# Patient Record
Sex: Female | Born: 1995 | Race: Black or African American | Hispanic: No | Marital: Single | Smoking: Never smoker
Health system: Southern US, Community
[De-identification: ages and names within clinical notes are randomized; demographics above are authoritative.]

## PROBLEM LIST (undated history)

## (undated) DIAGNOSIS — Z9889 Other specified postprocedural states: Secondary | ICD-10-CM

## (undated) DIAGNOSIS — F419 Anxiety disorder, unspecified: Secondary | ICD-10-CM

## (undated) DIAGNOSIS — T8859XA Other complications of anesthesia, initial encounter: Secondary | ICD-10-CM

## (undated) DIAGNOSIS — J020 Streptococcal pharyngitis: Secondary | ICD-10-CM

## (undated) DIAGNOSIS — U071 COVID-19: Secondary | ICD-10-CM

## (undated) DIAGNOSIS — R112 Nausea with vomiting, unspecified: Secondary | ICD-10-CM

## (undated) HISTORY — DX: Anxiety disorder, unspecified: F41.9

## (undated) HISTORY — DX: COVID-19: U07.1

## (undated) HISTORY — PX: KNEE SURGERY: SHX244

---
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
## 2014-06-07 ENCOUNTER — Emergency Department (HOSPITAL_COMMUNITY): Payer: 59

## 2014-06-07 ENCOUNTER — Emergency Department (HOSPITAL_COMMUNITY)
Admission: EM | Admit: 2014-06-07 | Discharge: 2014-06-07 | Disposition: A | Discharge status: Home / Self Care | Payer: 59 | Attending: Emergency Medicine | Admitting: Emergency Medicine

## 2014-06-07 ENCOUNTER — Encounter (HOSPITAL_COMMUNITY): Payer: Self-pay | Admitting: *Deleted

## 2014-06-07 DIAGNOSIS — Z720 Tobacco use: Secondary | ICD-10-CM | POA: Diagnosis not present

## 2014-06-07 DIAGNOSIS — S51811A Laceration without foreign body of right forearm, initial encounter: Secondary | ICD-10-CM | POA: Insufficient documentation

## 2014-06-07 DIAGNOSIS — Z79899 Other long term (current) drug therapy: Secondary | ICD-10-CM | POA: Insufficient documentation

## 2014-06-07 DIAGNOSIS — W25XXXA Contact with sharp glass, initial encounter: Secondary | ICD-10-CM | POA: Insufficient documentation

## 2014-06-07 DIAGNOSIS — Y9289 Other specified places as the place of occurrence of the external cause: Secondary | ICD-10-CM | POA: Insufficient documentation

## 2014-06-07 DIAGNOSIS — Z8709 Personal history of other diseases of the respiratory system: Secondary | ICD-10-CM | POA: Diagnosis not present

## 2014-06-07 DIAGNOSIS — Y9389 Activity, other specified: Secondary | ICD-10-CM | POA: Insufficient documentation

## 2014-06-07 DIAGNOSIS — Y998 Other external cause status: Secondary | ICD-10-CM | POA: Insufficient documentation

## 2014-06-07 DIAGNOSIS — S61511A Laceration without foreign body of right wrist, initial encounter: Secondary | ICD-10-CM | POA: Diagnosis present

## 2014-06-07 MED ORDER — LIDOCAINE-EPINEPHRINE (PF) 2 %-1:200000 IJ SOLN
20.0000 mL | Freq: Once | INTRAMUSCULAR | Status: AC
  Administered 2014-06-07: 20 mL
  Filled 2014-06-07: qty 20

## 2014-06-07 NOTE — ED Provider Notes (Signed)
CSN: [id number]     Arrival date & time 06/07/14  1005 History  This chart was scribed for Renne Crigler, PA-C, working with Margarita Grizzle, MD by Octavia Heir, ED Scribe. This patient was seen in room TR11C/TR11C and the patient's care was started at 10:44 AM.    Chief Complaint  Patient presents with  . Laceration    The history is provided by the patient. No language interpreter was used.    HPI Comments: Laurie Morrow is a 19 y.o. female who presents to the Emergency Department complaining of a laceration on her right wrist onset a few hours ago. Pt reports the laceration occurred after she was pushing on glass through the front door. She states that after she pushed through, the glass shattered. Pt is unknown of her last tetanus shot but states she is fully immunized and likely had one 8 years ago.  Past Medical History  Diagnosis Date  . Strep throat    Past Surgical History  Procedure Laterality Date  . Knee surgery     No family history on file. History  Substance Use Topics  . Smoking status: Light Tobacco Smoker  . Smokeless tobacco: Not on file  . Alcohol Use: 0.6 oz/week    1 Glasses of wine per week   OB History    No data available     Review of Systems  Constitutional: Negative for activity change.  Musculoskeletal: Positive for myalgias. Negative for back pain, joint swelling, arthralgias and neck pain.  Skin: Positive for wound.  Neurological: Negative for weakness and numbness.      Allergies  Review of patient's allergies indicates no known allergies.  Home Medications   Prior to Admission medications   Medication Sig Start Date End Date Taking? Authorizing Provider  Prenatal Vit-Fe Fumarate-FA (PRENATAL MULTIVITAMIN) TABS tablet Take 1 tablet by mouth daily at 12 noon.    Historical Provider, MD   Triage vitals: BP 126/71 mmHg  Pulse 73  Temp(Src) 98.1 F (36.7 C) (Oral)  Resp 16  Ht  (1.6 m)  Wt 150 lb (68.04 kg)  BMI 26.58 kg/m2   SpO2 100%  LMP 04/24/2014  Physical Exam  Constitutional: She appears well-developed and well-nourished.  HENT:  Head: Normocephalic and atraumatic.  Eyes: Pupils are equal, round, and reactive to light.  Neck: Normal range of motion. Neck supple.  Cardiovascular: Exam reveals no decreased pulses.   Musculoskeletal: She exhibits tenderness. She exhibits no edema.  3 cm, shallow, stellate laceration to the right distal forearm. Patient has full range of motion in her right wrist and fingers in all directions. Normal distal sensation. Base of wound fully explored. No foreign bodies seen or palpated. No tendon injury visualized.  Neurological: She is alert. No sensory deficit.  Motor, sensation, and vascular distal to the injury is fully intact.   Skin: Skin is warm and dry.  Psychiatric: She has a normal mood and affect.  Nursing note and vitals reviewed.   ED Course  Procedures  DIAGNOSTIC STUDIES: Oxygen Saturation is 100% on RA, normal by my interpretation.  COORDINATION OF CARE:  10:45 AM Discussed treatment plan which includes x-ray and stitches  with pt at bedside and pt agreed to plan.   Labs Review Labs Reviewed - No data to display  Imaging Review Dg Wrist Complete Right  06/07/2014   CLINICAL DATA:  Pushing on glass and glass shattered  EXAM: RIGHT WRIST - COMPLETE 3+ VIEW  COMPARISON:  None.  FINDINGS: No distal radius or ulnar fracture. Radiocarpal joint is intact. No carpal fracture. No soft tissue abnormality. No foreign body  IMPRESSION: No fracture or dislocation.  No foreign body.   Electronically Signed   By: Genevive BiStewart  Edmunds M.D.   On: 06/07/2014 11:56     EKG Interpretation None       LACERATION REPAIR Performed by: Carolee RotaGEIPLE,Thea Holshouser S Authorized by: Carolee RotaGEIPLE,Gianmarco Roye S Consent: Verbal consent obtained. Risks and benefits: risks, benefits and alternatives were discussed Consent given by: patient Patient identity confirmed: provided demographic data Prepped and  Draped in normal sterile fashion Wound explored  Laceration Location: R forearm/wrist  Laceration Length: 3cm  No Foreign Bodies seen or palpated  Anesthesia: local infiltration  Local anesthetic: lidocaine 2% with epinephrine  Anesthetic total: 3 ml  Irrigation method: skin scrub with dermal cleanser Amount of cleaning: standard  Skin closure: 5-0 Ethilon  Number of sutures: 5  Technique: Simple interrupted   Patient tolerance: Patient tolerated the procedure well with no immediate complications.   Patient counseled on wound care. Patient counseled on need to return or see PCP/urgent care for suture removal in 7 days. Patient was urged to return to the Emergency Department urgently with worsening pain, swelling, expanding erythema especially if it streaks away from the affected area, fever, or if they have any other concerns. Patient verbalized understanding.    MDM   Final diagnoses:  Laceration of forearm, right, initial encounter   Laceration, repaired per above. No concern for significant neurovascular injury. No concern for tendon injury. No foreign body seen or palpated or found by x-ray.  I personally performed the services described in this documentation, which was scribed in my presence. The recorded information has been reviewed and is accurate.   Renne CriglerJoshua Seif Teichert, PA-C 06/07/14 1659  Margarita Grizzleanielle Ray, MD 06/13/14 [phone]

## 2014-06-07 NOTE — ED Notes (Signed)
Declined W/C at D/C and was escorted to lobby by RN. 

## 2014-06-07 NOTE — ED Notes (Signed)
Pt reports lac occurred after pushing on glass front door.

## 2014-06-07 NOTE — Discharge Instructions (Signed)
Please read and follow all provided instructions.  Your diagnoses today include:  1. Laceration of forearm, right, initial encounter     Tests performed today include:  X-ray of the affected area that did not show any foreign bodies or broken bones  Vital signs. See below for your results today.   Medications prescribed:   None  Take any prescribed medications only as directed.   Home care instructions:  Follow any educational materials and wound care instructions contained in this packet.   Keep affected area above the level of your heart when possible to minimize swelling. Wash area gently twice a day with warm soapy water. Do not apply alcohol or hydrogen peroxide. Cover the area if it draining or weeping.   Follow-up instructions: Suture Removal: Return to the Emergency Department or see your primary care care doctor in 7 days for a recheck of your wound and removal of your sutures or staples.    Return instructions:  Return to the Emergency Department if you have:  Fever  Worsening pain  Worsening swelling of the wound  Pus draining from the wound  Redness of the skin that moves away from the wound, especially if it streaks away from the affected area   Any other emergent concerns  Your vital signs today were: BP 126/71 mmHg   Pulse 73   Temp(Src) 98.1 F (36.7 C) (Oral)   Resp 16   Ht 5\' 3"  (1.6 m)   Wt 150 lb (68.04 kg)   BMI 26.58 kg/m2   SpO2 100%   LMP 04/24/2014 If your blood pressure (BP) was elevated above 135/85 this visit, please have this repeated by your doctor within one month. --------------

## 2014-06-19 ENCOUNTER — Encounter (HOSPITAL_COMMUNITY): Payer: Self-pay | Admitting: Emergency Medicine

## 2014-06-19 ENCOUNTER — Emergency Department (INDEPENDENT_AMBULATORY_CARE_PROVIDER_SITE_OTHER)
Admission: EM | Admit: 2014-06-19 | Discharge: 2014-06-19 | Disposition: A | Discharge status: Home / Self Care | Payer: 59 | Source: Home / Self Care | Attending: Family Medicine | Admitting: Family Medicine

## 2014-06-19 DIAGNOSIS — Z4802 Encounter for removal of sutures: Secondary | ICD-10-CM

## 2014-06-19 NOTE — ED Provider Notes (Signed)
Laurie Morrow is a 19 y.o. female who presents to Urgent Care today for suture removal. Patient is here today for removal of sutures in her right volar wrist. She suffered a laceration on June 4 and had 5 simple interrupted sutures placed. She feels well. She has pain mildly in and around the area of the laceration but ultimately feels much better. No weakness or numbness fevers or chills.   Past Medical History  Diagnosis Date  . Strep throat    Past Surgical History  Procedure Laterality Date  . Knee surgery     History  Substance Use Topics  . Smoking status: Former Games developer  . Smokeless tobacco: Not on file  . Alcohol Use: 0.6 oz/week    1 Glasses of wine per week   ROS as above Medications: No current facility-administered medications for this encounter.   Current Outpatient Prescriptions  Medication Sig Dispense Refill  . Prenatal Vit-Fe Fumarate-FA (PRENATAL MULTIVITAMIN) TABS tablet Take 1 tablet by mouth daily at 12 noon.     No Known Allergies   Exam:  BP 111/71 mmHg  Pulse 67  Temp(Src) 98.1 F (36.7 C) (Oral)  Resp 20  SpO2 98%  LMP 04/22/2014 Gen: Well NAD Skin: Well-appearing laceration right forearm. Nontender. Mild bruising present. 5 simple interrupted sutures present and removed.  No results found for this or any previous visit (from the past 24 hour(s)). No results found.  Assessment and Plan: 19 y.o. female with suture removal right arm. Wound management scar minimization technique reviewed. Return as needed.  Discussed warning signs or symptoms. Please see discharge instructions. Patient expresses understanding.     Rodolph Bong, MD 06/19/14 [phone]

## 2014-06-19 NOTE — ED Notes (Signed)
C/o suture removal  States she hit a window with her right arm on Saturday  States when bumps into items she has pain States area is itchy also

## 2014-06-19 NOTE — Discharge Instructions (Signed)
Thank you for coming in today. ° ° °Scar Minimization °You will have a scar anytime you have surgery and a cut is made in the skin or you have something removed from your skin (mole, skin cancer, cyst). Although scars are unavoidable following surgery, there are ways to minimize their appearance. °It is important to follow all the instructions you receive from your caregiver about wound care. How your wound heals will influence the appearance of your scar. If you do not follow the wound care instructions as directed, complications such as infection may occur. Wound instructions include keeping the wound clean, moist, and not letting the wound form a scab. Some people form scars that are raised and lumpy (hypertrophic) or larger than the initial wound (keloidal). °HOME CARE INSTRUCTIONS  °· Follow wound care instructions as directed. °· Keep the wound clean by washing it with soap and water. °· Keep the wound moist with provided antibiotic cream or petroleum jelly until completely healed. Moisten twice a day for about 2 weeks. °· Get stitches (sutures) taken out at the scheduled time. °· Avoid touching or manipulating your wound unless needed. Wash your hands thoroughly before and after touching your wound. °· Follow all restrictions such as limits on exercise or work. This depends on where your scar is located. °· Keep the scar protected from sunburn. Cover the scar with sunscreen/sunblock with SPF 30 or higher. °· Gently massage the scar using a circular motion to help minimize the appearance of the scar. Do this only after the wound has closed and all the sutures have been removed. °· For hypertrophic or keloidal scars, there are several ways to treat and minimize their appearance. Methods include compression therapy, intralesional corticosteroids, laser therapy, or surgery. These methods are performed by your caregiver. °Remember that the scar may appear lighter or darker than your normal skin color. This  difference in color should even out with time. °SEEK MEDICAL CARE IF:  °· You have a fever. °· You develop signs of infection such as pain, redness, pus, and warmth. °· You have questions or concerns. °Document Released: 06/09/2009 Document Revised: 03/14/2011 Document Reviewed: 06/09/2009 °ExitCare® Patient Information ©2015 [employer]. This information is not intended to replace advice given to you by your health care provider. Make sure you discuss any questions you have with your health care provider. ° °

## 2015-10-16 ENCOUNTER — Encounter (HOSPITAL_COMMUNITY): Payer: Self-pay | Admitting: Emergency Medicine

## 2015-10-16 ENCOUNTER — Ambulatory Visit (HOSPITAL_COMMUNITY)
Admission: EM | Admit: 2015-10-16 | Discharge: 2015-10-16 | Disposition: A | Discharge status: Home / Self Care | Payer: 59 | Attending: Physician Assistant | Admitting: Physician Assistant

## 2015-10-16 DIAGNOSIS — N946 Dysmenorrhea, unspecified: Secondary | ICD-10-CM | POA: Diagnosis not present

## 2015-10-16 DIAGNOSIS — N921 Excessive and frequent menstruation with irregular cycle: Secondary | ICD-10-CM | POA: Diagnosis not present

## 2015-10-16 LAB — POCT I-STAT, CHEM 8
BUN: 9 mg/dL (ref 6–20)
CHLORIDE: 105 mmol/L (ref 101–111)
Calcium, Ion: 1.24 mmol/L (ref 1.15–1.40)
Creatinine, Ser: 0.8 mg/dL (ref 0.44–1.00)
Glucose, Bld: 84 mg/dL (ref 65–99)
HCT: 40 % (ref 36.0–46.0)
Hemoglobin: 13.6 g/dL (ref 12.0–15.0)
POTASSIUM: 4 mmol/L (ref 3.5–5.1)
SODIUM: 141 mmol/L (ref 135–145)
TCO2: 26 mmol/L (ref 0–100)

## 2015-10-16 LAB — POCT PREGNANCY, URINE: PREG TEST UR: NEGATIVE

## 2015-10-16 NOTE — ED Triage Notes (Signed)
[facility] a complaint of lower abdominal pain and abnormal vaginal bleeding that started last night.

## 2015-10-16 NOTE — Discharge Instructions (Signed)
Sorry you are having heavy menses. Your blood count is good and your vitals are good. Suspect this is from onset of painful and irregular periods. Suggest that if these cycles continue to check with OBGYN re other causes of these heavy periods. May use Aleve every 12 hours if needed for cramping. FU as needed.

## 2015-10-16 NOTE — ED Provider Notes (Signed)
CSN: [id number]     Arrival date & time 10/16/15  1725 History   First MD Initiated Contact with Patient 10/16/15 1844     Chief Complaint  Patient presents with  . Abdominal Pain  . Vaginal Bleeding   (Consider location/radiation/quality/duration/timing/severity/associated sxs/prior Treatment) 20 yo black female presents with abdominal cramping and heavy menses that started last night. She was worried she was losing too much blood because she felt fatigued today. She has a history of irregular menses and reports a history of "uterine cyst". Her menses have been irregular for some time. No current birth control.  She has no fevers, vaginal discharge or N, V, D. See remainder of ROS.       Past Medical History:  Diagnosis Date  . Strep throat    Past Surgical History:  Procedure Laterality Date  . KNEE SURGERY     History reviewed. No pertinent family history. Social History  Substance Use Topics  . Smoking status: Former Games developermoker  . Smokeless tobacco: Never Used  . Alcohol use 0.6 oz/week    1 Glasses of wine per week   OB History    No data available     Review of Systems  Constitutional: Negative for chills and fever.  Gastrointestinal: Positive for abdominal pain. Negative for abdominal distention, diarrhea, nausea and vomiting.       Abdominal cramping  Genitourinary: Positive for vaginal bleeding. Negative for dysuria, pelvic pain, vaginal discharge and vaginal pain.  Skin: Negative.     Allergies  Review of patient's allergies indicates no known allergies.  Home Medications   Prior to Admission medications   Medication Sig Start Date End Date Taking? Authorizing Provider  Prenatal Vit-Fe Fumarate-FA (PRENATAL MULTIVITAMIN) TABS tablet Take 1 tablet by mouth daily at 12 noon.    Historical Provider, MD   Meds Ordered and Administered this Visit  Medications - No data to display  BP 108/56 (BP Location: Left Arm)   Pulse 74   Temp 98.2 F (36.8 C) (Oral)    Resp 12   LMP 07/16/2015 (Approximate)   SpO2 100%  No data found.   Physical Exam  Constitutional: She is oriented to person, place, and time. She appears well-developed and well-nourished.  Abdominal: Soft. Bowel sounds are normal. There is tenderness. There is no rebound and no guarding.  Mild tenderness along the suprapubic region  Neurological: She is alert and oriented to person, place, and time.  Skin: Skin is warm and dry. No pallor.  Psychiatric: Her behavior is normal.  Nursing note and vitals reviewed.   Urgent Care Course   Clinical Course    Procedures (including critical care time)  Labs Review Labs Reviewed  POCT PREGNANCY, URINE  POCT I-STAT, CHEM 8    Imaging Review No results found.   Visual Acuity Review  Right Eye Distance:   Left Eye Distance:   Bilateral Distance:    Right Eye Near:   Left Eye Near:    Bilateral Near:         MDM   1. Dysmenorrhea   2. Menorrhagia with irregular cycle    Currently no emergent findings. Hgb is normal. Preg normal. Reassurance given. Suggest she keep a f/u with her OBGYN for further evaluation of irregular menses and menorrhagia to evaluate for any additional underlying causes.     Riki SheerMichelle G Young, PA-C 10/16/15 1942

## 2015-10-21 ENCOUNTER — Telehealth: Payer: Self-pay | Admitting: Internal Medicine

## 2015-10-21 NOTE — Telephone Encounter (Signed)
Mother is a current patient of Dr. Jonny RuizJohn.  She would like to know if Dr. Jonny RuizJohn would take her daughter on as a patient?

## 2015-10-21 NOTE — Telephone Encounter (Signed)
Ok with me 

## 2015-10-21 NOTE — Telephone Encounter (Signed)
Got scheduled  °

## 2015-11-03 ENCOUNTER — Ambulatory Visit (INDEPENDENT_AMBULATORY_CARE_PROVIDER_SITE_OTHER): Payer: 59 | Admitting: Internal Medicine

## 2015-11-03 ENCOUNTER — Other Ambulatory Visit (INDEPENDENT_AMBULATORY_CARE_PROVIDER_SITE_OTHER): Payer: 59

## 2015-11-03 ENCOUNTER — Encounter: Payer: Self-pay | Admitting: Internal Medicine

## 2015-11-03 DIAGNOSIS — Z0000 Encounter for general adult medical examination without abnormal findings: Secondary | ICD-10-CM | POA: Insufficient documentation

## 2015-11-03 LAB — CBC WITH DIFFERENTIAL/PLATELET
BASOS PCT: 0.5 % (ref 0.0–3.0)
Basophils Absolute: 0 10*3/uL (ref 0.0–0.1)
EOS ABS: 0 10*3/uL (ref 0.0–0.7)
Eosinophils Relative: 0.6 % (ref 0.0–5.0)
HEMATOCRIT: 36.8 % (ref 36.0–46.0)
Hemoglobin: 12.5 g/dL (ref 12.0–15.0)
LYMPHS ABS: 2.4 10*3/uL (ref 0.7–4.0)
Lymphocytes Relative: 42.7 % (ref 12.0–46.0)
MCHC: 34.1 g/dL (ref 30.0–36.0)
MCV: 90.7 fl (ref 78.0–100.0)
MONO ABS: 0.4 10*3/uL (ref 0.1–1.0)
Monocytes Relative: 6.3 % (ref 3.0–12.0)
NEUTROS ABS: 2.9 10*3/uL (ref 1.4–7.7)
NEUTROS PCT: 49.9 % (ref 43.0–77.0)
PLATELETS: 311 10*3/uL (ref 150.0–400.0)
RBC: 4.06 Mil/uL (ref 3.87–5.11)
RDW: 13.7 % (ref 11.5–15.5)
WBC: 5.7 10*3/uL (ref 4.0–10.5)

## 2015-11-03 LAB — URINALYSIS, ROUTINE W REFLEX MICROSCOPIC
Ketones, ur: 15 — AB
Leukocytes, UA: NEGATIVE
Nitrite: NEGATIVE
SPECIFIC GRAVITY, URINE: 1.015 (ref 1.000–1.030)
TOTAL PROTEIN, URINE-UPE24: NEGATIVE
Urine Glucose: NEGATIVE
Urobilinogen, UA: 1 (ref 0.0–1.0)
pH: 6.5 (ref 5.0–8.0)

## 2015-11-03 LAB — LIPID PANEL
CHOL/HDL RATIO: 3
Cholesterol: 162 mg/dL (ref 0–200)
HDL: 58.5 mg/dL (ref >39.00)
LDL Cholesterol: 91 mg/dL (ref 0–99)
NONHDL: 103.97
Triglycerides: 64 mg/dL (ref 0.0–149.0)
VLDL: 12.8 mg/dL (ref 0.0–40.0)

## 2015-11-03 LAB — BASIC METABOLIC PANEL
BUN: 10 mg/dL (ref 6–23)
CHLORIDE: 105 meq/L (ref 96–112)
CO2: 26 meq/L (ref 19–32)
Calcium: 9.5 mg/dL (ref 8.4–10.5)
Creatinine, Ser: 0.8 mg/dL (ref 0.40–1.20)
GFR: 115.64 mL/min (ref >60.00)
Glucose, Bld: 87 mg/dL (ref 70–99)
POTASSIUM: 4.1 meq/L (ref 3.5–5.1)
Sodium: 139 mEq/L (ref 135–145)

## 2015-11-03 LAB — HEPATIC FUNCTION PANEL
ALK PHOS: 57 U/L (ref 39–117)
ALT: 11 U/L (ref 0–35)
AST: 14 U/L (ref 0–37)
Albumin: 4.3 g/dL (ref 3.5–5.2)
BILIRUBIN DIRECT: 0.2 mg/dL (ref 0.0–0.3)
BILIRUBIN TOTAL: 1.1 mg/dL (ref 0.2–1.2)
TOTAL PROTEIN: 7.2 g/dL (ref 6.0–8.3)

## 2015-11-03 LAB — TSH: TSH: 1.31 u[IU]/mL (ref 0.35–4.50)

## 2015-11-03 NOTE — Progress Notes (Signed)
Pre visit review using our clinic review tool, if applicable. No additional management support is needed unless otherwise documented below in the visit note. 

## 2015-11-03 NOTE — Patient Instructions (Signed)
Please continue all other medications as before, and refills have been done if requested.  Please have the pharmacy call with any other refills you may need.  Please continue your efforts at being more active, low cholesterol diet, and weight control.  You are otherwise up to date with prevention measures today.  Please keep your appointments with your specialists as you may have planned - self refer to GYN  Please go to the LAB in the Basement (turn left off the elevator) for the tests to be done today  You will be contacted by phone if any changes need to be made immediately.  Otherwise, you will receive a letter about your results with an explanation, but please check with MyChart first.  Please remember to sign up for MyChart if you have not done so, as this will be important to you in the future with finding out test results, communicating by private email, and scheduling acute appointments online when needed.  Please return in 1 year for your yearly visit, or as needed

## 2015-11-03 NOTE — Progress Notes (Signed)
Subjective:    Patient ID: Laurie Morrow, female    DOB: 11/25/1995, 20 y.o.   MRN: [id number]  HPI  Here for wellness and f/u; father is patient here as well, Overall doing ok;  Pt denies Chest pain, worsening SOB, DOE, wheezing, orthopnea, PND, worsening LE edema, palpitations, dizziness or syncope.  Pt denies neurological change such as new headache, facial or extremity weakness.  Pt denies polydipsia, polyuria,  Pt states overall good compliance with treatment and medications, good tolerability, but not really been trying to follow any particular diet.  Pt denies worsening depressive symptoms, suicidal ideation or panic. No fever, night sweats, wt loss, loss of appetite, or other constitutional symptoms.  Pt states good ability with ADL's, has low fall risk, home safety reviewed and adequate, no other significant changes in hearing or vision, and fairly active Past Medical History:  Diagnosis Date  . Strep throat    Past Surgical History:  Procedure Laterality Date  . KNEE SURGERY     left    reports that she has quit smoking. She has never used smokeless tobacco. She reports that she drinks about 0.6 oz of alcohol per week . She reports that she does not use drugs. family history includes Diabetes in her mother; Hypertension in her mother. No Known Allergies No current outpatient prescriptions on file prior to visit.   No current facility-administered medications on file prior to visit.    Review of Systems Constitutional: Negative for increased diaphoresis, or other activity, appetite or siginficant weight change other than noted HENT: Negative for worsening hearing loss, ear pain, facial swelling, mouth sores and neck stiffness.   Eyes: Negative for other worsening pain, redness or visual disturbance.  Respiratory: Negative for choking or stridor Cardiovascular: Negative for other chest pain and palpitations.  Gastrointestinal: Negative for worsening diarrhea, blood in stool, or  abdominal distention Genitourinary: Negative for hematuria, flank pain or change in urine volume.  Musculoskeletal: Negative for myalgias or other joint complaints.  Skin: Negative for other color change and wound or drainage.  Neurological: Negative for syncope and numbness. other than noted Hematological: Negative for adenopathy. or other swelling Psychiatric/Behavioral: Negative for hallucinations, SI, self-injury, decreased concentration or other worsening agitation.  All other system neg per pt    Objective:   Physical Exam BP 122/70   Pulse 81   Temp 98.8 F (37.1 C) (Oral)   Resp 20   Wt 160 lb (72.6 kg)   LMP 07/16/2015 (Approximate)   SpO2 99%   BMI 28.34 kg/m  VS noted,  Constitutional: Pt is oriented to person, place, and time. Appears well-developed and well-nourished, in no significant distress Head: Normocephalic and atraumatic  Eyes: Conjunctivae and EOM are normal. Pupils are equal, round, and reactive to light Right Ear: External ear normal.  Left Ear: External ear normal Nose: Nose normal.  Mouth/Throat: Oropharynx is clear and moist  Neck: Normal range of motion. Neck supple. No JVD present. No tracheal deviation present or significant neck LA or mass Cardiovascular: Normal rate, regular rhythm, normal heart sounds and intact distal pulses.   Pulmonary/Chest: Effort normal and breath sounds without rales or wheezing  Abdominal: Soft. Bowel sounds are normal. NT. No HSM  Musculoskeletal: Normal range of motion. Exhibits no edema Lymphadenopathy: Has no cervical adenopathy.  Neurological: Pt is alert and oriented to person, place, and time. Pt has normal reflexes. No cranial nerve deficit. Motor grossly intact Skin: Skin is warm and dry. No rash noted or  new ulcers Psychiatric:  Has normal mood and affect. Behavior is normal.  No other new exam findings    Assessment & Plan:

## 2015-11-09 NOTE — Assessment & Plan Note (Signed)

## 2016-04-02 ENCOUNTER — Encounter (HOSPITAL_COMMUNITY): Payer: Self-pay | Admitting: Emergency Medicine

## 2016-04-02 ENCOUNTER — Ambulatory Visit (HOSPITAL_COMMUNITY)
Admission: EM | Admit: 2016-04-02 | Discharge: 2016-04-02 | Disposition: A | Discharge status: Home / Self Care | Payer: 59 | Attending: Internal Medicine | Admitting: Internal Medicine

## 2016-04-02 DIAGNOSIS — Z202 Contact with and (suspected) exposure to infections with a predominantly sexual mode of transmission: Secondary | ICD-10-CM | POA: Insufficient documentation

## 2016-04-02 DIAGNOSIS — Z113 Encounter for screening for infections with a predominantly sexual mode of transmission: Secondary | ICD-10-CM

## 2016-04-02 DIAGNOSIS — N898 Other specified noninflammatory disorders of vagina: Secondary | ICD-10-CM | POA: Insufficient documentation

## 2016-04-02 DIAGNOSIS — Z87891 Personal history of nicotine dependence: Secondary | ICD-10-CM | POA: Diagnosis not present

## 2016-04-02 DIAGNOSIS — Z711 Person with feared health complaint in whom no diagnosis is made: Secondary | ICD-10-CM

## 2016-04-02 MED ORDER — AZITHROMYCIN 250 MG PO TABS
ORAL_TABLET | ORAL | Status: AC
  Filled 2016-04-02: qty 4

## 2016-04-02 MED ORDER — CEFTRIAXONE SODIUM 250 MG IJ SOLR
250.0000 mg | Freq: Once | INTRAMUSCULAR | Status: AC
  Administered 2016-04-02: 250 mg via INTRAMUSCULAR

## 2016-04-02 MED ORDER — AZITHROMYCIN 250 MG PO TABS
1000.0000 mg | ORAL_TABLET | Freq: Once | ORAL | Status: AC
  Administered 2016-04-02: 1000 mg via ORAL

## 2016-04-02 MED ORDER — LIDOCAINE HCL (PF) 1 % IJ SOLN
INTRAMUSCULAR | Status: AC
  Filled 2016-04-02: qty 2

## 2016-04-02 MED ORDER — CEFTRIAXONE SODIUM 250 MG IJ SOLR
INTRAMUSCULAR | Status: AC
  Filled 2016-04-02: qty 250

## 2016-04-02 NOTE — Discharge Instructions (Signed)
You have been treated for chlamydia and gonorrhea today. It would take 3-5 days for the results to come back and we will call you with a result comes back.

## 2016-04-02 NOTE — ED Triage Notes (Signed)
PT reports a sexual partner tested positive for chlamydia. PT has no symptoms at this time.

## 2016-04-02 NOTE — ED Provider Notes (Signed)
CSN: [id number]     Arrival date & time 04/02/16  1512 History   First MD Initiated Contact with Patient 04/02/16 1619     Chief Complaint  Patient presents with  . Exposure to STD   (Consider location/radiation/quality/duration/timing/severity/associated sxs/prior Treatment) Patient is a healthy 21 -year-old female, presents today for STD testing. Her ex-boyfriend's sister told her last night and he had chlamydia. Patient doesn't know how long ago he had the chlamydia. Patient's last sexual encounter with him was 2 months ago. Patient is asymptomatic with no vaginal discharge, odor, itchiness or irritation.       Past Medical History:  Diagnosis Date  . Strep throat    Past Surgical History:  Procedure Laterality Date  . KNEE SURGERY     left   Family History  Problem Relation Age of Onset  . Diabetes Mother   . Hypertension Mother    Social History  Substance Use Topics  . Smoking status: Former Games developer  . Smokeless tobacco: Never Used  . Alcohol use 0.6 oz/week    1 Glasses of wine per week     Comment: 1 per week   OB History    No data available     Review of Systems  Constitutional: Negative for chills, fatigue and fever.  Respiratory: Negative.   Cardiovascular: Negative.   Genitourinary: Negative for dysuria, vaginal discharge and vaginal pain.    Allergies  Patient has no known allergies.  Home Medications   Prior to Admission medications   Not on File   Meds Ordered and Administered this Visit   Medications  cefTRIAXone (ROCEPHIN) injection 250 mg (not administered)  azithromycin (ZITHROMAX) tablet 1,000 mg (not administered)    BP 123/72   Pulse 77   Temp 98.9 F (37.2 C) (Oral)   Resp 16   Ht  (1.6 m)   Wt 140 lb (63.5 kg)   LMP 03/12/2016   SpO2 100%   BMI 24.80 kg/m  No data found.   Physical Exam  Constitutional: She appears well-developed and well-nourished.  Cardiovascular: Normal rate and regular rhythm.    Pulmonary/Chest: Effort normal and breath sounds normal.  Abdominal: Soft. Bowel sounds are normal. She exhibits no distension. There is no tenderness.  Genitourinary:  Genitourinary Comments: Labia minor and majora symmetric with no lesion. Small amt of white thin discharge noted at the vaginal entrance. More white thin discharge in moderate amt noted in the vaginal canal. Vaginal canal is otherwise pink, moist with no lesion noted. -CMT, -adnexal tenderness/mass, -uterine tenderness  Skin: Skin is warm and dry.  Nursing note and vitals reviewed.   Urgent Care Course     Procedures (including critical care time)  Labs Review Labs Reviewed  HIV ANTIBODY (ROUTINE TESTING)  CERVICOVAGINAL ANCILLARY ONLY    Imaging Review No results found.  MDM   1. Concern about sexually transmitted disease in female without diagnosis   2. Vaginal discharge    1) Cytology pending 2) Treated today presumptively with Rocephin 250 mg and Azithromycin 1000 mg 3) Informed that we will call her with results.      Lucia Estelle, NP 04/02/16 1710

## 2016-04-03 LAB — HIV ANTIBODY (ROUTINE TESTING W REFLEX): HIV Screen 4th Generation wRfx: NONREACTIVE

## 2016-04-04 LAB — CERVICOVAGINAL ANCILLARY ONLY
CHLAMYDIA, DNA PROBE: NEGATIVE
Neisseria Gonorrhea: NEGATIVE
WET PREP (BD AFFIRM): POSITIVE — AB

## 2017-03-05 ENCOUNTER — Encounter (HOSPITAL_COMMUNITY): Payer: Self-pay | Admitting: Emergency Medicine

## 2017-03-05 ENCOUNTER — Other Ambulatory Visit: Payer: Self-pay

## 2017-03-05 ENCOUNTER — Ambulatory Visit (HOSPITAL_COMMUNITY)
Admission: EM | Admit: 2017-03-05 | Discharge: 2017-03-05 | Disposition: A | Discharge status: Home / Self Care | Payer: 59 | Attending: Urgent Care | Admitting: Urgent Care

## 2017-03-05 DIAGNOSIS — J029 Acute pharyngitis, unspecified: Secondary | ICD-10-CM

## 2017-03-05 DIAGNOSIS — J020 Streptococcal pharyngitis: Secondary | ICD-10-CM | POA: Diagnosis not present

## 2017-03-05 LAB — POCT RAPID STREP A: Streptococcus, Group A Screen (Direct): POSITIVE — AB

## 2017-03-05 MED ORDER — PENICILLIN G BENZATHINE 1200000 UNIT/2ML IM SUSP
INTRAMUSCULAR | Status: AC
  Filled 2017-03-05: qty 2

## 2017-03-05 MED ORDER — PENICILLIN G BENZATHINE 1200000 UNIT/2ML IM SUSP
1.2000 10*6.[IU] | Freq: Once | INTRAMUSCULAR | Status: AC
  Administered 2017-03-05: 1.2 10*6.[IU] via INTRAMUSCULAR

## 2017-03-05 MED ORDER — PENICILLIN V POTASSIUM 500 MG PO TABS: 500.0000 mg | ORAL_TABLET | Freq: Three times a day (TID) | ORAL | 0 refills | Status: DC

## 2017-03-05 NOTE — ED Provider Notes (Signed)
  MRN: [id number] DOB: 03/05/1995  Subjective:   Laurie Morrow is a 22 y.o. female presenting for 1 day history of sore throat, difficulty swallowing. Has had difficulty with strep throat in the past, last time she was treated was ~3 years ago. Denies fever, sinus pain, congestion, ear pain, ear drainage, cough, chest pain, shob, wheezing, n/v, abdominal pain. Denies smoking cigarettes.  Laurie Morrow has No Known Allergies.  Laurie Morrow  has a past medical history of Strep throat. Also  has a past surgical history that includes Knee surgery.  Objective:   Vitals: BP 120/72 (BP Location: Right Arm)   Pulse (!) 102   Temp 98 F (36.7 C) (Temporal)   Wt 160 lb (72.6 kg)   LMP 02/11/2017 (Approximate)   SpO2 98%   BMI 28.34 kg/m   Physical Exam  Constitutional: She is oriented to person, place, and time. She appears well-developed and well-nourished.  HENT:  Mouth/Throat: Oropharynx is clear and moist.  TM's intact bilaterally, no effusions or erythema. Nasal passages patent. No sinus tenderness. Oropharynx clear, mucous membranes moist.  Eyes: Right eye exhibits no discharge. Left eye exhibits no discharge.  Cardiovascular: Normal rate, regular rhythm and intact distal pulses. Exam reveals no gallop and no friction rub.  No murmur heard. Pulmonary/Chest: No respiratory distress. She has no wheezes. She has no rales.  Lymphadenopathy:    She has cervical adenopathy (right-sided).  Neurological: She is alert and oriented to person, place, and time.  Skin: Skin is warm and dry.  Psychiatric: She has a normal mood and affect.   Results for orders placed or performed during the hospital encounter of 03/05/17 (from the past 24 hour(s))  POCT rapid strep A [facility]     Status: Abnormal   Collection Time: 03/05/17  8:44 PM  Result Value Ref Range   Streptococcus, Group A Screen (Direct) POSITIVE (A) NEGATIVE   Assessment and Plan :   Strep throat  Sore throat  I recommended patient  take amoxicillin given that she has had trouble with strep throat in the past. She wanted an injection instead because she is having difficulty with swallowing pills. I offered script for penicillin as patient prefers to use this.    Wallis BambergMani, Dulcinea Kinser, [place]PA-C 03/06/17 1547

## 2017-03-05 NOTE — Discharge Instructions (Addendum)
You may take 500mg Tylenol with ibuprofen 400-600mg every 6 hours for pain and inflammation. °

## 2017-03-05 NOTE — ED Triage Notes (Signed)
C/o sore throat with dysphagia onset last PM, denies HA or rhinitis

## 2017-07-10 ENCOUNTER — Ambulatory Visit (INDEPENDENT_AMBULATORY_CARE_PROVIDER_SITE_OTHER)
Admission: RE | Admit: 2017-07-10 | Discharge: 2017-07-10 | Disposition: A | Discharge status: Home / Self Care | Payer: 59 | Source: Ambulatory Visit | Attending: Internal Medicine | Admitting: Internal Medicine

## 2017-07-10 ENCOUNTER — Ambulatory Visit (INDEPENDENT_AMBULATORY_CARE_PROVIDER_SITE_OTHER): Payer: 59 | Admitting: Internal Medicine

## 2017-07-10 ENCOUNTER — Encounter: Payer: Self-pay | Admitting: Internal Medicine

## 2017-07-10 VITALS — BP 118/80 | HR 80 | Temp 98.7°F | Ht 63.0 in | Wt 161.0 lb

## 2017-07-10 DIAGNOSIS — M25561 Pain in right knee: Secondary | ICD-10-CM | POA: Insufficient documentation

## 2017-07-10 NOTE — Patient Instructions (Signed)
We will get you in with sports medicine tomorrow to check the knee. We are checking an x-ray of the knee today.   Take ibuprofen 600 mg three times a day for the next week to help the swelling go down faster and for the knee to recover faster.    RICE for Routine Care of Injuries Many injuries can be cared for using rest, ice, compression, and elevation (RICE therapy). Using RICE therapy can help to lessen pain and swelling. It can help your body to heal. Rest Reduce your normal activities and avoid using the injured part of your body. You can go back to your normal activities when you feel okay and your doctor says it is okay. Ice Do not put ice on your bare skin.  Put ice in a plastic bag.  Place a towel between your skin and the bag.  Leave the ice on for 20 minutes, 2-3 times a day.  Do this for as long as told by your doctor. Compression Compression means putting pressure on the injured area. This can be done with an elastic bandage. If an elastic bandage has been applied:  Remove and reapply the bandage every 3-4 hours or as told by your doctor.  Make sure the bandage is not wrapped too tight. Wrap the bandage more loosely if part of your body beyond the bandage is blue, swollen, cold, painful, or loses feeling (numb).  See your doctor if the bandage seems to make your problems worse.  Elevation Elevation means keeping the injured area raised. Raise the injured area above your heart or the center of your chest if you can. When should I get help? You should get help if:  You keep having pain and swelling.  Your symptoms get worse.  Get help right away if: You should get help right away if:  You have sudden bad pain at or below the area of your injury.  You have redness or more swelling around your injury.  You have tingling or numbness at or below the injury that does not go away when you take off the bandage.  This information is not intended to replace advice  given to you by your health care provider. Make sure you discuss any questions you have with your health care provider. Document Released: 06/08/2007 Document Revised: 11/17/2015 Document Reviewed: 11/27/2013 Elsevier Interactive Patient Education  2017 [employer]

## 2017-07-10 NOTE — Assessment & Plan Note (Addendum)
Ordered x-ray for alignment or knee cap fracture although unlikely. She is advised RICE and ibuprofen for pain. Given crutches at the visit for ambulation. 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 Refer to sports medicine and want her seen tomorrow to rule out tendon tear. She is not able to describe the mechanism of fall despite several lines of questioning.

## 2017-07-10 NOTE — Progress Notes (Signed)
   Subjective:    Patient ID: Laurie Morrow, female    DOB: 02/17/1995, 22 y.o.   MRN: [id number]  HPI The patient is a 22 YO female coming in for right knee pain. Injured right knee with fall at [employer] in the bathroom. She felt a pop. Cannot put pressure on it. Was not able to put weight on it immediately after the fall. Fall was last evening. She cannot recall mechanism but was on her side after the fall. Did not try to catch herself. She had swelling afterwards and put her leg up and iced it yesterday. Swelling is down a good amount today. Still pain with putting any weight on it and hopping or family to help her move around. Has not taken anything for pain. Pain 6/10 when trying to put weight on it. 1/10 when at rest with leg elevated. Stable since onset. Prior dislocation in the left knee and surgery and she is scared that this is the same. This did not feel as bad than the left knee.   Review of Systems  Constitutional: Positive for activity change. Negative for appetite change, chills, fatigue, fever and unexpected weight change.  Respiratory: Negative.   Cardiovascular: Negative.   Gastrointestinal: Negative.   Musculoskeletal: Positive for arthralgias, gait problem, joint swelling and myalgias. Negative for back pain.  Skin: Negative.   Neurological: Negative for dizziness, tremors, syncope and weakness.      Objective:   Physical Exam  Constitutional: She is oriented to person, place, and time. She appears well-developed and well-nourished.  HENT:  Head: Normocephalic and atraumatic.  Eyes: EOM are normal.  Neck: Normal range of motion.  Cardiovascular: Normal rate and regular rhythm.  Pulmonary/Chest: Effort normal and breath sounds normal. No respiratory distress. She has no wheezes. She has no rales.  Abdominal: Soft. She exhibits no distension. There is no tenderness. There is no rebound.  Musculoskeletal: She exhibits tenderness. She exhibits no edema.  Pain over the  right medial ligament and along the lateral side of the knee, no pain with pushing on kneecap. No pain over the tibia or fibula. No pain in the humerus. Mild edema 1+. No pain in the right calf. Right ACL and PCL intact.   Neurological: She is alert and oriented to person, place, and time. Coordination normal.  Skin: Skin is warm and dry.   Vitals:   07/10/17 1524  BP: 118/80  Pulse: 80  Temp: 98.7 F (37.1 C)  TempSrc: Oral  SpO2: 95%  Weight: 161 lb (73 kg)  Height: 5\' 3"  (1.6 m)      Assessment & Plan:

## 2017-07-11 ENCOUNTER — Ambulatory Visit (INDEPENDENT_AMBULATORY_CARE_PROVIDER_SITE_OTHER): Payer: 59 | Admitting: Family Medicine

## 2017-07-11 ENCOUNTER — Ambulatory Visit (INDEPENDENT_AMBULATORY_CARE_PROVIDER_SITE_OTHER): Payer: 59

## 2017-07-11 ENCOUNTER — Encounter: Payer: Self-pay | Admitting: Family Medicine

## 2017-07-11 VITALS — BP 116/78 | HR 96 | Ht 63.0 in

## 2017-07-11 DIAGNOSIS — M25561 Pain in right knee: Secondary | ICD-10-CM | POA: Diagnosis not present

## 2017-07-11 NOTE — Patient Instructions (Signed)
Nice to meet you  Please stay off the knee and keep it elevated  Please take motrin or tylenol for pain  Please follow up with me in 2 weeks.

## 2017-07-11 NOTE — Progress Notes (Signed)
Laurie PateeLondon B Bissette - 22 y.o. female MRN [id number]  Date of birth: 04/09/1995  SUBJECTIVE:  Including CC & ROS.  Chief Complaint  Patient presents with  . Right knee pain    Laurie Morrow is a 22 y.o. female that is presenting with right knee pain. She fell two days ago in her bathroom. She felt a pop in her knee.Pain at the medial aspect of her right knee. Admits to swelling and tenderness. Admits to mild to severe pain with flexion and extension. Difficulty bearing weight on her leg. She has been applying ice and elevating her knee. She has been using crutches since yesterday. She had a lateral release on her left knee. Denies any patella dislocation.   Independent review of the right knee x-ray from 7/8 shows normal radiographs.   Review of Systems  Constitutional: Negative for fever.  HENT: Negative for congestion.   Respiratory: Negative for cough.   Cardiovascular: Negative for chest pain.  Gastrointestinal: Negative for abdominal pain.  Musculoskeletal: Positive for gait problem.  Skin: Negative for color change.  Neurological: Negative for weakness.  Hematological: Negative for adenopathy.  Psychiatric/Behavioral: Negative for agitation.    HISTORY: Past Medical, Surgical, Social, and Family History Reviewed & Updated per EMR.   Pertinent Historical Findings include:  Past Medical History:  Diagnosis Date  . Strep throat     Past Surgical History:  Procedure Laterality Date  . KNEE SURGERY     left    No Known Allergies  Family History  Problem Relation Age of Onset  . Diabetes Mother   . Hypertension Mother      Social History   Socioeconomic History  . Marital status: Single    Spouse name: Not on file  . Number of children: Not on file  . Years of education: Not on file  . Highest education level: Not on file  Occupational History  . 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 Occupation: Consulting civil engineerstudent  Social Needs  . Financial resource strain: Not on file  . Food  insecurity:    Worry: Not on file    Inability: Not on file  . Transportation needs:    Medical: Not on file    Non-medical: Not on file  Tobacco Use  . Smoking status: Former Games developermoker  . Smokeless tobacco: Never Used  Substance and Sexual Activity  . Alcohol use: Yes    Alcohol/week: 0.6 oz    Types: 1 Glasses of wine per week    Comment: 1 per week  . Drug use: No  . Sexual activity: Yes    Birth control/protection: Condom  Lifestyle  . Physical activity:    Days per week: Not on file    Minutes per session: Not on file  . Stress: Not on file  Relationships  . Social connections:    Talks on phone: Not on file    Gets together: Not on file    Attends religious service: Not on file    Active member of club or organization: Not on file    Attends meetings of clubs or organizations: Not on file    Relationship status: Not on file  . Intimate partner violence:    Fear of current or ex partner: Not on file    Emotionally abused: Not on file    Physically abused: Not on file    Forced sexual activity: Not on file  Other Topics Concern  . Not on file  Social History Narrative  . Not on  file     PHYSICAL EXAM:  VS: BP 116/78 (BP Location: Left Arm, Patient Position: Sitting, Cuff Size: Normal)   Pulse 96   Ht 5\' 3"  (1.6 m)   LMP 07/08/2017   SpO2 98%   BMI 28.52 kg/m  Physical Exam Gen: NAD, alert, cooperative with exam, well-appearing ENT: normal lips, normal nasal mucosa,  Eye: normal EOM, normal conjunctiva and lids CV:  no edema, +2 pedal pulses   Resp: no accessory muscle use, non-labored,  Skin: no rashes, no areas of induration  Neuro: normal tone, normal sensation to touch Psych:  normal insight, alert and oriented MSK:  Right knee:  No obvious effusion  TTP of the medial joint line and medial peripatellar  Limited flexion actively. Active flexion causes pain  No instability with valgus or varus testing  Negative Lauchman's test  Pain with minimal  testing of McMurray's.  Pain with ambulation  Unable to stand and walk unassisted without pain  Neurovascularly intact   Limited ultrasound: right knee:  No effusion  Normal appearing QT and PT  Normal appearing medial meniscus  Lateral meniscus with effusion surround   Summary: findings suggestive of a lateral meniscal injury/irritation   Ultrasound and interpretation by Clare Gandy, MD     ASSESSMENT & PLAN:   Acute pain of right knee Pain seems to be related to lateral meniscal injury. Possible tear with pain with minimal flexion and findings on [place].  - continue crutches  - ibuprofen or tylenol for pain  - can use ice  - f/u in 2 weeks. If no improvement then consider MRI for large meniscal tear

## 2017-07-11 NOTE — Assessment & Plan Note (Signed)
Pain seems to be related to lateral meniscal injury. Possible tear with pain with minimal flexion and findings on US.  - continue crutches  - ibuprofen or tylenol for pain  - can use ice  - f/u in 2 weeks. If no improvement then consider MRI for large meniscal tear

## 2017-07-24 NOTE — Progress Notes (Deleted)
  Laurie Morrow - 22 y.o. female MRN [id number]  Date of birth: 10/27/1995  SUBJECTIVE:  Including CC & ROS.  No chief complaint on file.   Laurie Morrow is a 22 y.o. female that is  ***.  ***   Review of Systems  HISTORY: Past Medical, Surgical, Social, and Family History Reviewed & Updated per EMR.   Pertinent Historical Findings include:  Past Medical History:  Diagnosis Date  . Strep throat     Past Surgical History:  Procedure Laterality Date  . KNEE SURGERY     left    No Known Allergies  Family History  Problem Relation Age of Onset  . Diabetes Mother   . Hypertension Mother      Social History   Socioeconomic History  . Marital status: Single    Spouse name: Not on file  . Number of children: Not on file  . Years of education: Not on file  . Highest education level: Not on file  Occupational History  . 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 Occupation: Consulting civil engineerstudent  Social Needs  . Financial resource strain: Not on file  . Food insecurity:    Worry: Not on file    Inability: Not on file  . Transportation needs:    Medical: Not on file    Non-medical: Not on file  Tobacco Use  . Smoking status: Former Games developermoker  . Smokeless tobacco: Never Used  Substance and Sexual Activity  . Alcohol use: Yes    Alcohol/week: 0.6 oz    Types: 1 Glasses of wine per week    Comment: 1 per week  . Drug use: No  . Sexual activity: Yes    Birth control/protection: Condom  Lifestyle  . Physical activity:    Days per week: Not on file    Minutes per session: Not on file  . Stress: Not on file  Relationships  . Social connections:    Talks on phone: Not on file    Gets together: Not on file    Attends religious service: Not on file    Active member of club or organization: Not on file    Attends meetings of clubs or organizations: Not on file    Relationship status: Not on file  . Intimate partner violence:    Fear of current or ex partner: Not on file    Emotionally  abused: Not on file    Physically abused: Not on file    Forced sexual activity: Not on file  Other Topics Concern  . Not on file  Social History Narrative  . Not on file     PHYSICAL EXAM:  VS: LMP 07/08/2017  Physical Exam Gen: NAD, alert, cooperative with exam, well-appearing ENT: normal lips, normal nasal mucosa,  Eye: normal EOM, normal conjunctiva and lids CV:  no edema, +2 pedal pulses   Resp: no accessory muscle use, non-labored,  GI: no masses or tenderness, no hernia  Skin: no rashes, no areas of induration  Neuro: normal tone, normal sensation to touch Psych:  normal insight, alert and oriented MSK:  ***      ASSESSMENT & PLAN:   No problem-specific Assessment & Plan notes found for this encounter.

## 2017-07-25 ENCOUNTER — Ambulatory Visit: Payer: 59 | Admitting: Family Medicine

## 2017-07-25 DIAGNOSIS — Z0289 Encounter for other administrative examinations: Secondary | ICD-10-CM

## 2017-10-09 ENCOUNTER — Other Ambulatory Visit: Payer: Self-pay

## 2017-10-09 ENCOUNTER — Emergency Department (HOSPITAL_BASED_OUTPATIENT_CLINIC_OR_DEPARTMENT_OTHER)
Admission: EM | Admit: 2017-10-09 | Discharge: 2017-10-09 | Disposition: A | Discharge status: Home / Self Care | Payer: 59 | Attending: Emergency Medicine | Admitting: Emergency Medicine

## 2017-10-09 ENCOUNTER — Emergency Department (HOSPITAL_BASED_OUTPATIENT_CLINIC_OR_DEPARTMENT_OTHER): Payer: 59

## 2017-10-09 ENCOUNTER — Encounter (HOSPITAL_BASED_OUTPATIENT_CLINIC_OR_DEPARTMENT_OTHER): Payer: Self-pay | Admitting: *Deleted

## 2017-10-09 DIAGNOSIS — Z79899 Other long term (current) drug therapy: Secondary | ICD-10-CM | POA: Diagnosis not present

## 2017-10-09 DIAGNOSIS — Y9389 Activity, other specified: Secondary | ICD-10-CM | POA: Diagnosis not present

## 2017-10-09 DIAGNOSIS — Y999 Unspecified external cause status: Secondary | ICD-10-CM | POA: Diagnosis not present

## 2017-10-09 DIAGNOSIS — Y92410 Unspecified street and highway as the place of occurrence of the external cause: Secondary | ICD-10-CM | POA: Diagnosis not present

## 2017-10-09 DIAGNOSIS — S199XXA Unspecified injury of neck, initial encounter: Secondary | ICD-10-CM | POA: Diagnosis present

## 2017-10-09 DIAGNOSIS — Z87891 Personal history of nicotine dependence: Secondary | ICD-10-CM | POA: Diagnosis not present

## 2017-10-09 DIAGNOSIS — S139XXA Sprain of joints and ligaments of unspecified parts of neck, initial encounter: Secondary | ICD-10-CM | POA: Insufficient documentation

## 2017-10-09 MED ORDER — METHOCARBAMOL 500 MG PO TABS
500.0000 mg | ORAL_TABLET | Freq: Once | ORAL | Status: AC
  Administered 2017-10-09: 500 mg via ORAL
  Filled 2017-10-09: qty 1

## 2017-10-09 MED ORDER — IBUPROFEN 200 MG PO TABS
600.0000 mg | ORAL_TABLET | Freq: Once | ORAL | Status: AC
  Administered 2017-10-09: 21:00:00 600 mg via ORAL
  Filled 2017-10-09: qty 1

## 2017-10-09 MED ORDER — IBUPROFEN 600 MG PO TABS: 600.0000 mg | ORAL_TABLET | Freq: Three times a day (TID) | ORAL | 0 refills | Status: DC | PRN

## 2017-10-09 MED ORDER — METHOCARBAMOL 500 MG PO TABS: 500.0000 mg | ORAL_TABLET | Freq: Three times a day (TID) | ORAL | 0 refills | Status: DC | PRN
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
## 2017-10-09 NOTE — ED Notes (Signed)
Pt. Reports she has R side neck pain with c/o some all over neck pain.
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
## 2019-04-26 ENCOUNTER — Other Ambulatory Visit: Payer: Self-pay

## 2019-04-26 ENCOUNTER — Emergency Department (HOSPITAL_BASED_OUTPATIENT_CLINIC_OR_DEPARTMENT_OTHER)
Admission: EM | Admit: 2019-04-26 | Discharge: 2019-04-26 | Disposition: A | Discharge status: Home / Self Care | Payer: No Typology Code available for payment source | Attending: Emergency Medicine | Admitting: Emergency Medicine

## 2019-04-26 ENCOUNTER — Encounter (HOSPITAL_BASED_OUTPATIENT_CLINIC_OR_DEPARTMENT_OTHER): Payer: Self-pay

## 2019-04-26 DIAGNOSIS — K0889 Other specified disorders of teeth and supporting structures: Secondary | ICD-10-CM | POA: Diagnosis present

## 2019-04-26 DIAGNOSIS — K0501 Acute gingivitis, non-plaque induced: Secondary | ICD-10-CM | POA: Insufficient documentation

## 2019-04-26 DIAGNOSIS — Z87891 Personal history of nicotine dependence: Secondary | ICD-10-CM | POA: Diagnosis not present

## 2019-04-26 DIAGNOSIS — K0510 Chronic gingivitis, plaque induced: Secondary | ICD-10-CM

## 2019-04-26 MED ORDER — LIDOCAINE VISCOUS HCL 2 % MT SOLN
15.0000 mL | Freq: Once | OROMUCOSAL | Status: AC
  Administered 2019-04-26: 15 mL via OROMUCOSAL
  Filled 2019-04-26: qty 15

## 2019-04-26 MED ORDER — AMOXICILLIN 500 MG PO CAPS: 500.0000 mg | ORAL_CAPSULE | Freq: Two times a day (BID) | ORAL | 0 refills | Status: DC

## 2019-04-26 MED ORDER — CHLORHEXIDINE GLUCONATE 0.12 % MT SOLN: 15.0000 mL | Freq: Two times a day (BID) | OROMUCOSAL | 0 refills | Status: DC

## 2019-04-26 NOTE — ED Notes (Signed)
Lower gum behind the lower teeth is slightly swollen and red.

## 2019-04-26 NOTE — ED Provider Notes (Signed)
[facility] EMERGENCY DEPARTMENT Provider Note   CSN: [id number] Arrival date & time: 04/26/19  4098     History Chief Complaint  Patient presents with  . Oral Swelling    Laurie Morrow is a 24 y.o. female.  HPI 24 year old female with no significant medical history presents to the ER with 2-day history of gum and tongue swelling.  Patient states that she started noticing it around lunchtime yesterday, with progressive worsening at night.  Swelling started to lower gumline, with progressing tongue swelling per patient.  She has never had this before.  Denies any known allergies.  She has not taken anything for the pain.   She still able to tolerate secretions well, denies difficulty swallowing, throat pain, fevers, chills, excessive drooling, swelling under the tongue, IVDU.    Past Medical History:  Diagnosis Date  . Strep throat     Patient Active Problem List   Diagnosis Date Noted  . Acute pain of right knee 07/10/2017  . Preventative health care 11/03/2015    Past Surgical History:  Procedure Laterality Date  . KNEE SURGERY     left     OB History   No obstetric history on file.     Family History  Problem Relation Age of Onset  . Diabetes Mother   . Hypertension Mother     Social History   Tobacco Use  . Smoking status: Former Games developer  . Smokeless tobacco: Never Used  Substance Use Topics  . Alcohol use: Yes    Alcohol/week: 1.0 standard drinks    Types: 1 Glasses of wine per week    Comment: 1 per week  . Drug use: No    Home Medications Prior to Admission medications   Medication Sig Start Date End Date Taking? Authorizing Provider  amoxicillin (AMOXIL) 500 MG capsule Take 1 capsule (500 mg total) by mouth 2 (two) times daily. 04/26/19   Mare Ferrari, PA-C  chlorhexidine (PERIDEX) 0.12 % solution Use as directed 15 mLs in the mouth or throat 2 (two) times daily. 04/26/19   Mare Ferrari, PA-C  ibuprofen (ADVIL,MOTRIN) 600 MG  tablet Take 1 tablet (600 mg total) by mouth every 8 (eight) hours as needed for moderate pain. 10/09/17   Shaune Pollack, MD  methocarbamol (ROBAXIN) 500 MG tablet Take 1 tablet (500 mg total) by mouth every 8 (eight) hours as needed for muscle spasms. 10/09/17   Shaune Pollack, MD    Allergies    Patient has no known allergies.  Review of Systems   Review of Systems  Constitutional: Negative for appetite change, chills, fatigue and fever.  HENT: Positive for dental problem and drooling. Negative for ear pain, facial swelling, mouth sores, sinus pain, sore throat, trouble swallowing and voice change.   Eyes: Negative for pain.  Respiratory: Negative for shortness of breath, wheezing and stridor.   Cardiovascular: Negative for chest pain.  Gastrointestinal: Negative for abdominal pain.  Skin: Negative for color change, pallor, rash and wound.  Allergic/Immunologic: Negative for environmental allergies, food allergies and immunocompromised state.    Physical Exam Updated Vital Signs BP 127/82   Pulse 60   Temp 98.1 F (36.7 C) (Oral)   Resp 18   Ht 5\' 3"  (1.6 m)   Wt 72.6 kg   LMP 04/19/2019   SpO2 100%   BMI 28.34 kg/m   Physical Exam Vitals and nursing note reviewed.  Constitutional:      General: She is not in acute  distress.    Appearance: She is well-developed.  HENT:     Head: Normocephalic and atraumatic.     Mouth/Throat:     Mouth: Mucous membranes are moist.     Pharynx: Oropharynx is clear. Posterior oropharyngeal erythema present. No oropharyngeal exudate.     Comments: Noted tongue piercing.  Inner lip tattooed.  Tenderness to palpation to inner lower gumline with mild erythema around teeth 24/25.  Moderate dentition, erythema to the gingiva with noted plaque on lower and upper teeth.  Uvula midline.  No noticeable exudate, abscesses.  No noticeable sublingual/submandibular swelling.  No other lesions/abscesses noted in oral cavity.  Patient tolerating  secretions well. Eyes:     Extraocular Movements: Extraocular movements intact.     Conjunctiva/sclera: Conjunctivae normal.     Pupils: Pupils are equal, round, and reactive to light.  Cardiovascular:     Rate and Rhythm: Normal rate and regular rhythm.     Heart sounds: No murmur.  Pulmonary:     Effort: Pulmonary effort is normal. No respiratory distress.     Breath sounds: Normal breath sounds.  Abdominal:     Palpations: Abdomen is soft.     Tenderness: There is no abdominal tenderness.  Musculoskeletal:     Cervical back: Neck supple.  Skin:    General: Skin is warm and dry.  Neurological:     General: No focal deficit present.     Mental Status: She is alert and oriented to person, place, and time.     ED Results / Procedures / Treatments   Labs (all labs ordered are listed, but only abnormal results are displayed) Labs Reviewed - No data to display  EKG None  Radiology No results found.  Procedures Procedures (including critical care time)  Medications Ordered in ED Medications  lidocaine (XYLOCAINE) 2 % viscous mouth solution 15 mL (15 mLs Mouth/Throat Given 04/26/19 1042)    ED Course  I have reviewed the triage vital signs and the nursing notes.  Pertinent labs & imaging results that were available during my care of the patient were reviewed by me and considered in my medical decision making (see chart for details).    MDM Rules/Calculators/A&P                     24 year old female with gumline pain and tongue swelling as of yesterday. On presentation to the ER, patient is alert and oriented, nontoxic-appearing, no tripoding, tolerating secretions well.  Vitals reassuring.  On physical exam, noted erythema to lower inner gumline and TTP around teeth 24/25.   Patient reports tongue swelling, but I do not appreciate any excessive swelling.  Uvula midline, no tonsillar exudates.  No other lesions/abscesses noted in oral cavity.  No sublingual/submandibular  swelling.  Clinical picture not consistent with peritonsillar/retrophryngeal abscess, dental abscess, Ludwig's angina, spreading infection.  Suspect gingivitis, possibly secondary to irritation from piercing as most of her erythema/pain is in the area where the lower part of her piercing makes contact with her gumline..  Will treat with amoxicillin, Peridex oral solution.  Referral to dentist, stressed follow-up.  Return precautions given.  All patient's questions have been answered, she voices understanding and is agreeable to the above plan.  Patient was seen and evaluated by Dr. Gilford Raid and she is agreeable with the above plan.   Final Clinical Impression(s) / ED Diagnoses Final diagnoses:  Gingivitis    Rx / DC Orders ED Discharge Orders  Ordered    amoxicillin (AMOXIL) 500 MG capsule  2 times daily     04/26/19 1022    chlorhexidine (PERIDEX) 0.12 % solution  2 times daily     04/26/19 1030           Leone Brand 04/26/19 1127    Jacalyn Lefevre, MD 04/26/19 1231

## 2019-04-26 NOTE — ED Notes (Signed)
ED Provider at bedside. 

## 2019-04-26 NOTE — ED Triage Notes (Signed)
Pt arrives with oral swelling in tongue and gum line around bottom teeth. Pt reports this started bothering her yesterday and the swelling started last night. Pt able to speak and maintain saliva. No meds PTA.

## 2019-04-26 NOTE — Discharge Instructions (Addendum)
You were seen in the ER for swelling to your gums and tongue.  Do not think this is an allergic reaction, likely a infection of your gums that potentially could have caused swelling to your tongue.  I prescribed antibiotic, take twice a day for 7 days.  Make sure to finish the course of antibiotics to prevent resistance. I have also provided a mouth wash, please use twice a day for infection.  Please make sure to follow-up with a dentist as soon as possible.  I have provided a referral for a local dentist in [place].  Please call that number today and schedule an appointment.  Return to the ER if your symptoms worsen.

## 2019-05-13 ENCOUNTER — Other Ambulatory Visit: Payer: Self-pay

## 2019-05-14 ENCOUNTER — Encounter: Payer: Self-pay | Admitting: Family Medicine

## 2019-05-14 ENCOUNTER — Ambulatory Visit (INDEPENDENT_AMBULATORY_CARE_PROVIDER_SITE_OTHER): Payer: No Typology Code available for payment source | Admitting: Family Medicine

## 2019-05-14 VITALS — BP 100/68 | HR 77 | Temp 97.7°F | Ht 63.0 in | Wt 169.6 lb

## 2019-05-14 DIAGNOSIS — N939 Abnormal uterine and vaginal bleeding, unspecified: Secondary | ICD-10-CM

## 2019-05-14 DIAGNOSIS — Z0000 Encounter for general adult medical examination without abnormal findings: Secondary | ICD-10-CM | POA: Diagnosis not present

## 2019-05-14 DIAGNOSIS — E538 Deficiency of other specified B group vitamins: Secondary | ICD-10-CM

## 2019-05-14 DIAGNOSIS — Z23 Encounter for immunization: Secondary | ICD-10-CM

## 2019-05-14 DIAGNOSIS — E611 Iron deficiency: Secondary | ICD-10-CM

## 2019-05-14 DIAGNOSIS — F418 Other specified anxiety disorders: Secondary | ICD-10-CM | POA: Diagnosis not present

## 2019-05-14 DIAGNOSIS — N938 Other specified abnormal uterine and vaginal bleeding: Secondary | ICD-10-CM | POA: Diagnosis not present

## 2019-05-14 DIAGNOSIS — R7989 Other specified abnormal findings of blood chemistry: Secondary | ICD-10-CM

## 2019-05-14 HISTORY — DX: Abnormal uterine and vaginal bleeding, unspecified: N93.9

## 2019-05-14 HISTORY — DX: Other specified anxiety disorders: F41.8

## 2019-05-14 LAB — COMPREHENSIVE METABOLIC PANEL
ALT: 16 U/L (ref 0–35)
AST: 17 U/L (ref 0–37)
Albumin: 3.8 g/dL (ref 3.5–5.2)
Alkaline Phosphatase: 57 U/L (ref 39–117)
BUN: 14 mg/dL (ref 6–23)
CO2: 27 mEq/L (ref 19–32)
Calcium: 9.2 mg/dL (ref 8.4–10.5)
Chloride: 105 mEq/L (ref 96–112)
Creatinine, Ser: 0.79 mg/dL (ref 0.40–1.20)
GFR: 107.08 mL/min (ref >60.00)
Glucose, Bld: 100 mg/dL — ABNORMAL HIGH (ref 70–99)
Potassium: 4.4 mEq/L (ref 3.5–5.1)
Sodium: 137 mEq/L (ref 135–145)
Total Bilirubin: 0.5 mg/dL (ref 0.2–1.2)
Total Protein: 6.7 g/dL (ref 6.0–8.3)

## 2019-05-14 LAB — CBC
HCT: 31.6 % — ABNORMAL LOW (ref 36.0–46.0)
Hemoglobin: 10.6 g/dL — ABNORMAL LOW (ref 12.0–15.0)
MCHC: 33.4 g/dL (ref 30.0–36.0)
MCV: 93.8 fl (ref 78.0–100.0)
Platelets: 319 10*3/uL (ref 150.0–400.0)
RBC: 3.37 Mil/uL — ABNORMAL LOW (ref 3.87–5.11)
RDW: 13.9 % (ref 11.5–15.5)
WBC: 5.5 10*3/uL (ref 4.0–10.5)

## 2019-05-14 LAB — LIPID PANEL
Cholesterol: 158 mg/dL (ref 0–200)
HDL: 59.7 mg/dL (ref >39.00)
LDL Cholesterol: 85 mg/dL (ref 0–99)
NonHDL: 98.49
Total CHOL/HDL Ratio: 3
Triglycerides: 69 mg/dL (ref 0.0–149.0)
VLDL: 13.8 mg/dL (ref 0.0–40.0)

## 2019-05-14 LAB — TSH: TSH: 4.67 u[IU]/mL — ABNORMAL HIGH (ref 0.35–4.50)

## 2019-05-14 NOTE — Patient Instructions (Signed)
Dysfunctional Uterine Bleeding Dysfunctional uterine bleeding is abnormal bleeding from the uterus. Dysfunctional uterine bleeding includes:  A menstrual period that comes earlier or later than usual.  A menstrual period that is lighter or heavier than usual, or has large blood clots.  Vaginal bleeding between menstrual periods.  Skipping one or more menstrual periods.  Vaginal bleeding after sex.  Vaginal bleeding after menopause. Follow these instructions at home: Eating and drinking   Eat well-balanced meals. Include foods that are high in iron, such as liver, meat, shellfish, green leafy vegetables, and eggs.  To prevent or treat constipation, your health care provider may recommend that you: ? Drink enough fluid to keep your urine pale yellow. ? Take over-the-counter or prescription medicines. ? Eat foods that are high in fiber, such as beans, whole grains, and fresh fruits and vegetables. ? Limit foods that are high in fat and processed sugars, such as fried or sweet foods. Medicines  Take over-the-counter and prescription medicines only as told by your health care provider.  Do not change medicines without talking with your health care provider.  Aspirin or medicines that contain aspirin may make the bleeding worse. Do not take those medicines: ? During the week before your menstrual period. ? During your menstrual period.  If you were prescribed iron pills, take them as told by your health care provider. Iron pills help to replace iron that your body loses because of this condition. Activity  If you need to change your sanitary pad or tampon more than one time every 2 hours: ? Lie in bed with your feet raised (elevated). ? Place a cold pack on your lower abdomen. ? Rest as much as possible until the bleeding stops or slows down.  Do not try to lose weight until the bleeding has stopped and your blood iron level is back to normal. General instructions   For two  months, write down: ? When your menstrual period starts. ? When your menstrual period ends. ? When any abnormal vaginal bleeding occurs. ? What problems you notice.  Keep all follow up visits as told by your health care provider. This is important. Contact a health care provider if you:  Feel light-headed or weak.  Have nausea and vomiting.  Cannot eat or drink without vomiting.  Feel dizzy or have diarrhea while you are taking medicines.  Are taking birth control pills or hormones, and you want to change them or stop taking them. Get help right away if:  You develop a fever or chills.  You need to change your sanitary pad or tampon more than one time per hour.  Your vaginal bleeding becomes heavier, or your flow contains clots more often.  You develop pain in your abdomen.  You lose consciousness.  You develop a rash. Summary  Dysfunctional uterine bleeding is abnormal bleeding from the uterus.  It includes menstrual bleeding of abnormal duration, volume, or regularity.  Bleeding after sex and after menopause are also considered dysfunctional uterine bleeding. This information is not intended to replace advice given to you by your health care provider. Make sure you discuss any questions you have with your health care provider. Document Revised: 05/31/2017 Document Reviewed: 05/31/2017 Elsevier Patient Education  2020 [employer] Inc[employer]  Health Maintenance for Postmenopausal Women Menopause is a normal process in which your ability to get pregnant comes to an end. This process happens slowly over many months or years, usually between the ages of 48 and 55. Menopause is complete when you   have missed your menstrual periods for 12 months. It is important to talk with your health care provider about some of the most common conditions that affect women after menopause (postmenopausal women). These include heart disease, cancer, and bone loss (osteoporosis). Adopting a healthy  lifestyle and getting preventive care can help to promote your health and wellness. The actions you take can also lower your chances of developing some of these common conditions. What should I know about menopause? During menopause, you may get a number of symptoms, such as:  Hot flashes. These can be moderate or severe.  Night sweats.  Decrease in sex drive.  Mood swings.  Headaches.  Tiredness.  Irritability.  Memory problems.  Insomnia. Choosing to treat or not to treat these symptoms is a decision that you make with your health care provider. Do I need hormone replacement therapy?  Hormone replacement therapy is effective in treating symptoms that are caused by menopause, such as hot flashes and night sweats.  Hormone replacement carries certain risks, especially as you become older. If you are thinking about using estrogen or estrogen with progestin, discuss the benefits and risks with your health care provider. What is my risk for heart disease and stroke? The risk of heart disease, heart attack, and stroke increases as you age. One of the causes may be a change in the body's hormones during menopause. This can affect how your body uses dietary fats, triglycerides, and cholesterol. Heart attack and stroke are medical emergencies. There are many things that you can do to help prevent heart disease and stroke. Watch your blood pressure  High blood pressure causes heart disease and increases the risk of stroke. This is more likely to develop in people who have high blood pressure readings, are of African descent, or are overweight.  Have your blood pressure checked: ? Every 3-5 years if you are 18-39 years of age. ? Every year if you are 40 years old or older. Eat a healthy diet   Eat a diet that includes plenty of vegetables, fruits, low-fat dairy products, and lean protein.  Do not eat a lot of foods that are high in solid fats, added sugars, or sodium. Get regular  exercise Get regular exercise. This is one of the most important things you can do for your health. Most adults should:  Try to exercise for at least 150 minutes each week. The exercise should increase your heart rate and make you sweat (moderate-intensity exercise).  Try to do strengthening exercises at least twice each week. Do these in addition to the moderate-intensity exercise.  Spend less time sitting. Even light physical activity can be beneficial. Other tips  Work with your health care provider to achieve or maintain a healthy weight.  Do not use any products that contain nicotine or tobacco, such as cigarettes, e-cigarettes, and chewing tobacco. If you need help quitting, ask your health care provider.  Know your numbers. Ask your health care provider to check your cholesterol and your blood sugar (glucose). Continue to have your blood tested as directed by your health care provider. Do I need screening for cancer? Depending on your health history and family history, you may need to have cancer screening at different stages of your life. This may include screening for:  Breast cancer.  Cervical cancer.  Lung cancer.  Colorectal cancer. What is my risk for osteoporosis? After menopause, you may be at increased risk for osteoporosis. Osteoporosis is a condition in which bone destruction happens more   quickly than new bone creation. To help prevent osteoporosis or the bone fractures that can happen because of osteoporosis, you may take the following actions:  If you are 19-50 years old, get at least 1,000 mg of calcium and at least 600 mg of vitamin D per day.  If you are older than age 50 but younger than age 70, get at least 1,200 mg of calcium and at least 600 mg of vitamin D per day.  If you are older than age 70, get at least 1,200 mg of calcium and at least 800 mg of vitamin D per day. Smoking and drinking excessive alcohol increase the risk of osteoporosis. Eat foods that  are rich in calcium and vitamin D, and do weight-bearing exercises several times each week as directed by your health care provider. How does menopause affect my mental health? Depression may occur at any age, but it is more common as you become older. Common symptoms of depression include:  Low or sad mood.  Changes in sleep patterns.  Changes in appetite or eating patterns.  Feeling an overall lack of motivation or enjoyment of activities that you previously enjoyed.  Frequent crying spells. Talk with your health care provider if you think that you are experiencing depression. General instructions See your health care provider for regular wellness exams and vaccines. This may include:  Scheduling regular health, dental, and eye exams.  Getting and maintaining your vaccines. These include: ? Influenza vaccine. Get this vaccine each year before the flu season begins. ? Pneumonia vaccine. ? Shingles vaccine. ? Tetanus, diphtheria, and pertussis (Tdap) booster vaccine. Your health care provider may also recommend other immunizations. Tell your health care provider if you have ever been abused or do not feel safe at home. Summary  Menopause is a normal process in which your ability to get pregnant comes to an end.  This condition causes hot flashes, night sweats, decreased interest in sex, mood swings, headaches, or lack of sleep.  Treatment for this condition may include hormone replacement therapy.  Take actions to keep yourself healthy, including exercising regularly, eating a healthy diet, watching your weight, and checking your blood pressure and blood sugar levels.  Get screened for cancer and depression. Make sure that you are up to date with all your vaccines. This information is not intended to replace advice given to you by your health care provider. Make sure you discuss any questions you have with your health care provider. Document Revised: 12/13/2017 Document  Reviewed: 12/13/2017 Elsevier Patient Education  2020 [employer] Inc.  Preventive Care 21-39 Years Old, Female Preventive care refers to visits with your health care provider and lifestyle choices that can promote health and wellness. This includes:  A yearly physical exam. This may also be called an annual well check.  Regular dental visits and eye exams.  Immunizations.  Screening for certain conditions.  Healthy lifestyle choices, such as eating a healthy diet, getting regular exercise, not using drugs or products that contain nicotine and tobacco, and limiting alcohol use. What can I expect for my preventive care visit? Physical exam Your health care provider will check your:  Height and weight. This may be used to calculate body mass index (BMI), which tells if you are at a healthy weight.  Heart rate and blood pressure.  Skin for abnormal spots. Counseling Your health care provider may ask you questions about your:  Alcohol, tobacco, and drug use.  Emotional well-being.  Home and relationship well-being.  Sexual   activity.  Eating habits.  Work and work environment.  Method of birth control.  Menstrual cycle.  Pregnancy history. What immunizations do I need?  Influenza (flu) vaccine  This is recommended every year. Tetanus, diphtheria, and pertussis (Tdap) vaccine  You may need a Td booster every 10 years. Varicella (chickenpox) vaccine  You may need this if you have not been vaccinated. Human papillomavirus (HPV) vaccine  If recommended by your health care provider, you may need three doses over 6 months. Measles, mumps, and rubella (MMR) vaccine  You may need at least one dose of MMR. You may also need a second dose. Meningococcal conjugate (MenACWY) vaccine  One dose is recommended if you are age 19-21 years and a first-year college student living in a residence hall, or if you have one of several medical conditions. You may also need additional  booster doses. Pneumococcal conjugate (PCV13) vaccine  You may need this if you have certain conditions and were not previously vaccinated. Pneumococcal polysaccharide (PPSV23) vaccine  You may need one or two doses if you smoke cigarettes or if you have certain conditions. Hepatitis A vaccine  You may need this if you have certain conditions or if you travel or work in places where you may be exposed to hepatitis A. Hepatitis B vaccine  You may need this if you have certain conditions or if you travel or work in places where you may be exposed to hepatitis B. Haemophilus influenzae type b (Hib) vaccine  You may need this if you have certain conditions. You may receive vaccines as individual doses or as more than one vaccine together in one shot (combination vaccines). Talk with your health care provider about the risks and benefits of combination vaccines. What tests do I need?  Blood tests  Lipid and cholesterol levels. These may be checked every 5 years starting at age 20.  Hepatitis C test.  Hepatitis B test. Screening  Diabetes screening. This is done by checking your blood sugar (glucose) after you have not eaten for a while (fasting).  Sexually transmitted disease (STD) testing.  BRCA-related cancer screening. This may be done if you have a family history of breast, ovarian, tubal, or peritoneal cancers.  Pelvic exam and Pap test. This may be done every 3 years starting at age 21. Starting at age 30, this may be done every 5 years if you have a Pap test in combination with an HPV test. Talk with your health care provider about your test results, treatment options, and if necessary, the need for more tests. Follow these instructions at home: Eating and drinking   Eat a diet that includes fresh fruits and vegetables, whole grains, lean protein, and low-fat dairy.  Take vitamin and mineral supplements as recommended by your health care provider.  Do not drink alcohol  if: ? Your health care provider tells you not to drink. ? You are pregnant, may be pregnant, or are planning to become pregnant.  If you drink alcohol: ? Limit how much you have to 0-1 drink a day. ? Be aware of how much alcohol is in your drink. In the [place] one drink equals one 12 oz bottle of beer (355 mL), one 5 oz glass of wine (148 mL), or one 1 oz glass of hard liquor (44 mL). Lifestyle  Take daily care of your teeth and gums.  Stay active. Exercise for at least 30 minutes on 5 or more days each week.  Do not use any products that   contain nicotine or tobacco, such as cigarettes, e-cigarettes, and chewing tobacco. If you need help quitting, ask your health care provider.  If you are sexually active, practice safe sex. Use a condom or other form of birth control (contraception) in order to prevent pregnancy and STIs (sexually transmitted infections). If you plan to become pregnant, see your health care provider for a preconception visit. What's next?  Visit your health care provider once a year for a well check visit.  Ask your health care provider how often you should have your eyes and teeth checked.  Stay up to date on all vaccines. This information is not intended to replace advice given to you by your health care provider. Make sure you discuss any questions you have with your health care provider. Document Revised: 08/31/2017 Document Reviewed: 08/31/2017 Elsevier Patient Education  2020 [employer] Inc.  

## 2019-05-14 NOTE — Addendum Note (Signed)
Addended by: Lerry Liner on: 05/14/2019 04:06 PM   Modules accepted: Orders

## 2019-05-14 NOTE — Progress Notes (Addendum)
New Patient Office Visit  Subjective:  Patient ID: Laurie Morrow, female    DOB: 09/27/1995  Age: 24 y.o. MRN: [id number]  CC:  Chief Complaint  Patient presents with  . Establish Care    New patient, would like referral to gynecologist for irregular periods and therapist to talk to     HPI Alferd Patee presents for establishment of care and for follow-up of sadness and anxiety that she has been experiencing over the last 4 to 5 months.  She has worked all through the pandemic.  She lives with her friend and her friends child.  Recently started exercising.  She has been treated for anxiety in the past.  Menses have been abnormal over the last several months as well.  Appetite has been diminished.  Has experienced trouble sleeping and falling asleep.  Rarely drinks alcohol.  She quit smoking sometime ago.  She does smoke marijuana regularly.  Mom has diabetes and hypertension.  Past Medical History:  Diagnosis Date  . Anxiety   . Strep throat     Past Surgical History:  Procedure Laterality Date  . KNEE SURGERY     left    Family History  Problem Relation Age of Onset  . Diabetes Mother   . Hypertension Mother   . Diabetes Maternal Grandmother   . Hypertension Maternal Grandmother   . Hypertension Maternal Grandfather   . Diabetes Maternal Grandfather     Social History   Socioeconomic History  . Marital status: Single    Spouse name: Not on file  . Number of children: Not on file  . Years of education: Not on file  . Highest education level: Not on file  Occupational History  . Occupation: Child psychotherapist  . Occupation: Consulting civil engineer  Tobacco Use  . Smoking status: Former Games developer  . Smokeless tobacco: Never Used  Substance and Sexual Activity  . Alcohol use: Yes    Alcohol/week: 3.0 standard drinks    Types: 3 Glasses of wine per week    Comment: 1 per week  . Drug use: Yes    Types: Marijuana  . Sexual activity: Yes    Birth control/protection: Condom  Other  Topics Concern  . Not on file  Social History Narrative  . Not on file   Social Determinants of Health   Financial Resource Strain:   . Difficulty of Paying Living Expenses:   Food Insecurity:   . Worried About Programme researcher, broadcasting/film/video in the Last Year:   . Barista in the Last Year:   Transportation Needs:   . Freight forwarder (Medical):   Marland Kitchen Lack of Transportation (Non-Medical):   Physical Activity:   . Days of Exercise per Week:   . Minutes of Exercise per Session:   Stress:   . Feeling of Stress :   Social Connections:   . Frequency of Communication with Friends and Family:   . Frequency of Social Gatherings with Friends and Family:   . Attends Religious Services:   . Active Member of Clubs or Organizations:   . Attends Banker Meetings:   Marland Kitchen Marital Status:   Intimate Partner Violence:   . Fear of Current or Ex-Partner:   . Emotionally Abused:   Marland Kitchen Physically Abused:   . Sexually Abused:     ROS Review of Systems  Constitutional: Negative.   HENT: Negative.   Eyes: Negative for photophobia and visual disturbance.  Respiratory: Negative.   Cardiovascular: Negative.  Gastrointestinal: Negative.   Endocrine: Negative for polyphagia and polyuria.  Genitourinary: Negative.   Musculoskeletal: Negative for joint swelling and myalgias.  Skin: Negative.   Allergic/Immunologic: Negative for immunocompromised state.  Neurological: Negative for tremors and speech difficulty.  Hematological: Does not bruise/bleed easily.  Psychiatric/Behavioral: Positive for decreased concentration and sleep disturbance. The patient is nervous/anxious.    Depression screen PHQ 2/9 05/14/2019  Decreased Interest 3  Down, Depressed, Hopeless 2  PHQ - 2 Score 5  Altered sleeping 2  Tired, decreased energy 2  Change in appetite 2  Feeling bad or failure about yourself  1  Trouble concentrating 1  Moving slowly or fidgety/restless 0  Suicidal thoughts 0  PHQ-9 Score  13  Difficult doing work/chores Very difficult    Objective:   Today's Vitals: BP 100/68   Pulse 77   Temp 97.7 F (36.5 C) (Tympanic)   Ht 5\' 3"  (1.6 m)   Wt 169 lb 9.6 oz (76.9 kg)   LMP 04/19/2019   BMI 30.04 kg/m   Physical Exam Vitals and nursing note reviewed.  Constitutional:      General: She is not in acute distress.    Appearance: Normal appearance. She is not ill-appearing or toxic-appearing.  HENT:     Head: Normocephalic and atraumatic.     Right Ear: Tympanic membrane, ear canal and external ear normal. There is no impacted cerumen.     Left Ear: Tympanic membrane, ear canal and external ear normal. There is no impacted cerumen.     Nose: Nose normal.     Mouth/Throat:     Mouth: Mucous membranes are moist.     Pharynx: No oropharyngeal exudate or posterior oropharyngeal erythema.  Eyes:     General:        Right eye: No discharge.        Left eye: No discharge.     Extraocular Movements: Extraocular movements intact.     Conjunctiva/sclera: Conjunctivae normal.  Cardiovascular:     Rate and Rhythm: Normal rate and regular rhythm.  Pulmonary:     Effort: Pulmonary effort is normal.     Breath sounds: Normal breath sounds.  Musculoskeletal:     Cervical back: No rigidity or tenderness.  Lymphadenopathy:     Cervical: No cervical adenopathy.  Skin:    General: Skin is warm and dry.  Neurological:     Mental Status: She is alert and oriented to person, place, and time.  Psychiatric:        Mood and Affect: Mood normal.        Behavior: Behavior normal.     Assessment & Plan:   Problem List Items Addressed This Visit      Genitourinary   DUB (dysfunctional uterine bleeding) - Primary   Relevant Orders   Ambulatory referral to Obstetrics / Gynecology   hCG, serum, qualitative (Completed)   Iron, TIBC and Ferritin Panel (Completed)     Other   Healthcare maintenance   Relevant Orders   CBC (Completed)   Comprehensive metabolic panel  (Completed)   Lipid panel (Completed)   TSH (Completed)   Urinalysis, Routine w reflex microscopic   Depression with anxiety   Relevant Orders   Ambulatory referral to Psychology   B12 deficiency   Relevant Orders   Vitamin B12 (Completed)   Need for Tdap vaccination   Relevant Orders   Tdap vaccine greater than or equal to 7yo IM (Completed)   Elevated TSH   Relevant Orders  TSH    Other Visit Diagnoses    Iron deficiency       Relevant Medications   Iron, Ferrous Gluconate, 256 (28 Fe) MG TABS      Outpatient Encounter Medications as of 05/14/2019  Medication Sig  . vitamin B-12 (CYANOCOBALAMIN) 250 MCG tablet Take 250 mcg by mouth daily.  . Iron, Ferrous Gluconate, 256 (28 Fe) MG TABS Take one daily.  . [DISCONTINUED] amoxicillin (AMOXIL) 500 MG capsule Take 1 capsule (500 mg total) by mouth 2 (two) times daily. (Patient not taking: Reported on 05/14/2019)  . [DISCONTINUED] chlorhexidine (PERIDEX) 0.12 % solution Use as directed 15 mLs in the mouth or throat 2 (two) times daily. (Patient not taking: Reported on 05/14/2019)  . [DISCONTINUED] ibuprofen (ADVIL,MOTRIN) 600 MG tablet Take 1 tablet (600 mg total) by mouth every 8 (eight) hours as needed for moderate pain. (Patient not taking: Reported on 05/14/2019)  . [DISCONTINUED] methocarbamol (ROBAXIN) 500 MG tablet Take 1 tablet (500 mg total) by mouth every 8 (eight) hours as needed for muscle spasms. (Patient not taking: Reported on 05/14/2019)   No facility-administered encounter medications on file as of 05/14/2019.    Follow-up: Return in about 3 months (around 08/14/2019), or if symptoms worsen or fail to improve.  Patient was given information on health maintenance, disease prevention and dysfunctional uterine bleeding.  She would like to try counseling first before medicines.  Libby Maw, MD

## 2019-05-15 LAB — HCG, SERUM, QUALITATIVE: Preg, Serum: NEGATIVE

## 2019-05-15 LAB — IRON,TIBC AND FERRITIN PANEL
%SAT: 21 % (calc) (ref 16–45)
Ferritin: 11 ng/mL — ABNORMAL LOW (ref 16–154)
Iron: 75 ug/dL (ref 40–190)
TIBC: 365 mcg/dL (calc) (ref 250–450)

## 2019-05-15 LAB — VITAMIN B12: Vitamin B-12: 649 pg/mL (ref 211–911)

## 2019-05-17 ENCOUNTER — Encounter: Payer: Self-pay | Admitting: Family Medicine

## 2019-05-17 DIAGNOSIS — Z23 Encounter for immunization: Secondary | ICD-10-CM | POA: Insufficient documentation

## 2019-05-17 DIAGNOSIS — E538 Deficiency of other specified B group vitamins: Secondary | ICD-10-CM | POA: Insufficient documentation

## 2019-05-17 DIAGNOSIS — R7989 Other specified abnormal findings of blood chemistry: Secondary | ICD-10-CM | POA: Insufficient documentation

## 2019-05-17 HISTORY — DX: Deficiency of other specified B group vitamins: E53.8

## 2019-05-17 MED ORDER — IRON (FERROUS GLUCONATE) 256 (28 FE) MG PO TABS: ORAL_TABLET | ORAL | 5 refills | Status: DC

## 2019-05-17 NOTE — Addendum Note (Signed)
Addended by: Nadene Rubins A on: 05/17/2019 01:08 PM   Modules accepted: Orders

## 2019-06-04 ENCOUNTER — Encounter: Payer: Self-pay | Admitting: Family Medicine

## 2019-06-04 ENCOUNTER — Ambulatory Visit: Payer: No Typology Code available for payment source | Admitting: Family Medicine

## 2019-06-04 ENCOUNTER — Ambulatory Visit: Payer: Self-pay | Admitting: Psychology

## 2019-06-04 ENCOUNTER — Telehealth: Payer: Self-pay | Admitting: Family Medicine

## 2019-06-04 ENCOUNTER — Other Ambulatory Visit: Payer: Self-pay

## 2019-06-04 VITALS — BP 110/80 | Temp 97.9°F | Ht 63.0 in | Wt 170.0 lb

## 2019-06-04 DIAGNOSIS — E611 Iron deficiency: Secondary | ICD-10-CM | POA: Diagnosis not present

## 2019-06-04 DIAGNOSIS — F418 Other specified anxiety disorders: Secondary | ICD-10-CM | POA: Diagnosis not present

## 2019-06-04 DIAGNOSIS — R7989 Other specified abnormal findings of blood chemistry: Secondary | ICD-10-CM | POA: Diagnosis not present

## 2019-06-04 HISTORY — DX: Iron deficiency: E61.1

## 2019-06-04 LAB — TSH: TSH: 2.23 u[IU]/mL (ref 0.35–4.50)

## 2019-06-04 NOTE — Progress Notes (Signed)
Established Patient Office Visit  Subjective:  Patient ID: Laurie Morrow, female    DOB: [birth year]  Age: 24 y.o. MRN: [id number]  CC:  Chief Complaint  Patient presents with  . Follow-up    3 week follow up /Depression and anxiety    HPI Laurie Morrow presents for follow-up of her anxiety depression, iron deficiency, anemia, DU B and elevated TSH.  Has not been to the pharmacy to pick up her iron tablet.  Has not heard from GYN about follow-up for her DU B.  Does have an appointment with psychology for talking therapy.  Past Medical History:  Diagnosis Date  . Anxiety   . Strep throat     Past Surgical History:  Procedure Laterality Date  . KNEE SURGERY     left    Family History  Problem Relation Age of Onset  . Diabetes Mother   . Hypertension Mother   . Diabetes Maternal Grandmother   . Hypertension Maternal Grandmother   . Hypertension Maternal Grandfather   . Diabetes Maternal Grandfather     Social History   Socioeconomic History  . Marital status: Single    Spouse name: Not on file  . Number of children: Not on file  . Years of education: Not on file  . Highest education level: Not on file  Occupational History  . Occupation: Child psychotherapist  . Occupation: Consulting civil engineer  Tobacco Use  . Smoking status: Former Games developer  . Smokeless tobacco: Never Used  Substance and Sexual Activity  . Alcohol use: Yes    Alcohol/week: 3.0 standard drinks    Types: 3 Glasses of wine per week    Comment: 1 per week  . Drug use: Yes    Types: Marijuana  . Sexual activity: Yes    Birth control/protection: Condom  Other Topics Concern  . Not on file  Social History Narrative  . Not on file   Social Determinants of Health   Financial Resource Strain:   . Difficulty of Paying Living Expenses:   Food Insecurity:   . Worried About Programme researcher, broadcasting/film/video in the Last Year:   . Barista in the Last Year:   Transportation Needs:   . Freight forwarder (Medical):   Marland Kitchen  Lack of Transportation (Non-Medical):   Physical Activity:   . Days of Exercise per Week:   . Minutes of Exercise per Session:   Stress:   . Feeling of Stress :   Social Connections:   . Frequency of Communication with Friends and Family:   . Frequency of Social Gatherings with Friends and Family:   . Attends Religious Services:   . Active Member of Clubs or Organizations:   . Attends Banker Meetings:   Marland Kitchen Marital Status:   Intimate Partner Violence:   . Fear of Current or Ex-Partner:   . Emotionally Abused:   Marland Kitchen Physically Abused:   . Sexually Abused:     Outpatient Medications Prior to Visit  Medication Sig Dispense Refill  . phentermine 30 MG capsule Take 30 mg by mouth every morning.    . vitamin B-12 (CYANOCOBALAMIN) 250 MCG tablet Take 250 mcg by mouth daily.    . Iron, Ferrous Gluconate, 256 (28 Fe) MG TABS Take one daily. 30 tablet 5   No facility-administered medications prior to visit.    No Known Allergies  ROS Review of Systems  Constitutional: Negative.   Respiratory: Negative.   Cardiovascular: Negative.   Gastrointestinal:  Negative.       Objective:    Physical Exam  Constitutional: She is oriented to person, place, and time. She appears well-developed and well-nourished. No distress.  HENT:  Head: Normocephalic and atraumatic.  Right Ear: External ear normal.  Left Ear: External ear normal.  Eyes: Conjunctivae are normal. Right eye exhibits no discharge. Left eye exhibits no discharge. No scleral icterus.  Pulmonary/Chest: Effort normal.  Neurological: She is alert and oriented to person, place, and time.  Skin: Skin is warm and dry. She is not diaphoretic.  Psychiatric: She has a normal mood and affect. Her behavior is normal.    BP 110/80 (BP Location: Left Arm, Patient Position: Sitting, Cuff Size: Normal)   Temp 97.9 F (36.6 C) (Temporal)   Ht 5\' 3"  (1.6 m)   Wt 170 lb (77.1 kg)   LMP  (LMP Unknown)   BMI 30.11 kg/m  Wt  Readings from Last 3 Encounters:  06/04/19 170 lb (77.1 kg)  05/14/19 169 lb 9.6 oz (76.9 kg)  04/26/19 160 lb (72.6 kg)     Health Maintenance Due  Topic Date Due  . COVID-19 Vaccine (1) Never done  . PAP-Cervical Cytology Screening  Never done  . PAP SMEAR-Modifier  Never done    There are no preventive care reminders to display for this patient.  Lab Results  Component Value Date   TSH 4.67 (H) 05/14/2019   Lab Results  Component Value Date   WBC 5.5 05/14/2019   HGB 10.6 (L) 05/14/2019   HCT 31.6 (L) 05/14/2019   MCV 93.8 05/14/2019   PLT 319.0 05/14/2019   Lab Results  Component Value Date   NA 137 05/14/2019   K 4.4 05/14/2019   CO2 27 05/14/2019   GLUCOSE 100 (H) 05/14/2019   BUN 14 05/14/2019   CREATININE 0.79 05/14/2019   BILITOT 0.5 05/14/2019   ALKPHOS 57 05/14/2019   AST 17 05/14/2019   ALT 16 05/14/2019   PROT 6.7 05/14/2019   ALBUMIN 3.8 05/14/2019   CALCIUM 9.2 05/14/2019   GFR 107.08 05/14/2019   Lab Results  Component Value Date   CHOL 158 05/14/2019   Lab Results  Component Value Date   HDL 59.70 05/14/2019   Lab Results  Component Value Date   LDLCALC 85 05/14/2019   Lab Results  Component Value Date   TRIG 69.0 05/14/2019   Lab Results  Component Value Date   CHOLHDL 3 05/14/2019   No results found for: HGBA1C    Assessment & Plan:   Problem List Items Addressed This Visit      Other   Depression with anxiety   Elevated TSH   Relevant Orders   TSH   Iron deficiency - Primary      No orders of the defined types were placed in this encounter.   Follow-up: Return in about 3 months (around 09/04/2019).   Given information on iron rich diet and exercising to improve health.  Follow-up soon with psychology for talking therapy.  Libby Maw, MD

## 2019-06-04 NOTE — Patient Instructions (Addendum)
Health Maintenance Due  Topic Date Due  . COVID-19 Vaccine (1) Never done  . PAP-Cervical Cytology Screening  Never done  . PAP SMEAR-Modifier  Never done    Depression screen PHQ 2/9 05/14/2019  Decreased Interest 3  Down, Depressed, Hopeless 2  PHQ - 2 Score 5  Altered sleeping 2  Tired, decreased energy 2  Change in appetite 2  Feeling bad or failure about yourself  1  Trouble concentrating 1  Moving slowly or fidgety/restless 0  Suicidal thoughts 0  PHQ-9 Score 13  Difficult doing work/chores Very difficult    Iron-Rich Diet  Iron is a mineral that helps your body to produce hemoglobin. Hemoglobin is a protein in red blood cells that carries oxygen to your body's tissues. Eating too little iron may cause you to feel weak and tired, and it can increase your risk of infection. Iron is naturally found in many foods, and many foods have iron added to them (iron-fortified foods). You may need to follow an iron-rich diet if you do not have enough iron in your body due to certain medical conditions. The amount of iron that you need each day depends on your age, your sex, and any medical conditions you have. Follow instructions from your health care provider or a diet and nutrition specialist (dietitian) about how much iron you should eat each day. What are tips for following this plan? Reading food labels  Check food labels to see how many milligrams (mg) of iron are in each serving. Cooking  Cook foods in pots and pans that are made from iron.  Take these steps to make it easier for your body to absorb iron from certain foods: ? Soak beans overnight before cooking. ? Soak whole grains overnight and drain them before using. ? Ferment flours before baking, such as by using yeast in bread dough. Meal planning  When you eat foods that contain iron, you should eat them with foods that are high in vitamin C. These include oranges, peppers, tomatoes, potatoes, and mango. Vitamin C helps  your body to absorb iron. General information  Take iron supplements only as told by your health care provider. An overdose of iron can be life-threatening. If you were prescribed iron supplements, take them with orange juice or a vitamin C supplement.  When you eat iron-fortified foods or take an iron supplement, you should also eat foods that naturally contain iron, such as meat, poultry, and fish. Eating naturally iron-rich foods helps your body to absorb the iron that is added to other foods or contained in a supplement.  Certain foods and drinks prevent your body from absorbing iron properly. Avoid eating these foods in the same meal as iron-rich foods or with iron supplements. These foods include: ? Coffee, black tea, and red wine. ? Milk, dairy products, and foods that are high in calcium. ? Beans and soybeans. ? Whole grains. What foods should I eat? Fruits Prunes. Raisins. Eat fruits high in vitamin C, such as oranges, grapefruits, and strawberries, alongside iron-rich foods. Vegetables Spinach (cooked). Green peas. Broccoli. Fermented vegetables. Eat vegetables high in vitamin C, such as leafy greens, potatoes, bell peppers, and tomatoes, alongside iron-rich foods. Grains Iron-fortified breakfast cereal. Iron-fortified whole-wheat bread. Enriched rice. Sprouted grains. Meats and other proteins Beef liver. Oysters. Beef. Shrimp. [place]. Chicken. Tuna. Sardines. Chickpeas. Nuts. Tofu. Pumpkin seeds. Beverages Tomato juice. Fresh orange juice. Prune juice. Hibiscus tea. Fortified instant breakfast shakes. Sweets and desserts Blackstrap molasses. Seasonings and condiments Tahini.  Fermented soy sauce. Other foods Wheat germ. The items listed above may not be a complete list of recommended foods and beverages. Contact a dietitian for more information. What foods should I avoid? Grains Whole grains. Bran cereal. Bran flour. Oats. Meats and other proteins Soybeans. Products made  from soy protein. Black beans. Lentils. Mung beans. Split peas. Dairy Milk. Cream. Cheese. Yogurt. Cottage cheese. Beverages Coffee. Black tea. Red wine. Sweets and desserts Cocoa. Chocolate. Ice cream. Other foods Basil. Oregano. Large amounts of parsley. The items listed above may not be a complete list of foods and beverages to avoid. Contact a dietitian for more information. Summary  Iron is a mineral that helps your body to produce hemoglobin. Hemoglobin is a protein in red blood cells that carries oxygen to your body's tissues.  Iron is naturally found in many foods, and many foods have iron added to them (iron-fortified foods).  When you eat foods that contain iron, you should eat them with foods that are high in vitamin C. Vitamin C helps your body to absorb iron.  Certain foods and drinks prevent your body from absorbing iron properly, such as whole grains and dairy products. You should avoid eating these foods in the same meal as iron-rich foods or with iron supplements. This information is not intended to replace advice given to you by your health care provider. Make sure you discuss any questions you have with your health care provider. Document Revised: 12/02/2016 Document Reviewed: 11/15/2016 Elsevier Patient Education  2020 [employer].  Exercising to Stay Healthy To become healthy and stay healthy, it is recommended that you do moderate-intensity and vigorous-intensity exercise. You can tell that you are exercising at a moderate intensity if your heart starts beating faster and you start breathing faster but can still hold a conversation. You can tell that you are exercising at a vigorous intensity if you are breathing much harder and faster and cannot hold a conversation while exercising. Exercising regularly is important. It has many health benefits, such as:  Improving overall fitness, flexibility, and endurance.  Increasing bone density.  Helping with weight  control.  Decreasing body fat.  Increasing muscle strength.  Reducing stress and tension.  Improving overall health. How often should I exercise? Choose an activity that you enjoy, and set realistic goals. Your health care provider can help you make an activity plan that works for you. Exercise regularly as told by your health care provider. This may include:  Doing strength training two times a week, such as: ? Lifting weights. ? Using resistance bands. ? Push-ups. ? Sit-ups. ? Yoga.  Doing a certain intensity of exercise for a given amount of time. Choose from these options: ? A total of 150 minutes of moderate-intensity exercise every week. ? A total of 75 minutes of vigorous-intensity exercise every week. ? A mix of moderate-intensity and vigorous-intensity exercise every week. Children, pregnant women, people who have not exercised regularly, people who are overweight, and older adults may need to talk with a health care provider about what activities are safe to do. If you have a medical condition, be sure to talk with your health care provider before you start a new exercise program. What are some exercise ideas? Moderate-intensity exercise ideas include:  Walking 1 mile (1.6 km) in about 15 minutes.  Biking.  Hiking.  Golfing.  Dancing.  Water aerobics. Vigorous-intensity exercise ideas include:  Walking 4.5 miles (7.2 km) or more in about 1 hour.  Jogging or running 5  miles (8 km) in about 1 hour.  Biking 10 miles (16.1 km) or more in about 1 hour.  Lap swimming.  Roller-skating or in-line skating.  Cross-country skiing.  Vigorous competitive sports, such as football, basketball, and soccer.  Jumping rope.  Aerobic dancing. What are some everyday activities that can help me to get exercise?  [place] work, such as: ? Pushing a Conservation officer, nature. ? Raking and bagging leaves.  Washing your car.  Pushing a stroller.  Shoveling  snow.  Gardening.  Washing windows or floors. How can I be more active in my day-to-day activities?  Use stairs instead of an elevator.  Take a walk during your lunch break.  If you drive, park your car farther away from your work or school.  If you take public transportation, get off one stop early and walk the rest of the way.  Stand up or walk around during all of your indoor phone calls.  Get up, stretch, and walk around every 30 minutes throughout the day.  Enjoy exercise with a friend. Support to continue exercising will help you keep a regular routine of activity. What guidelines can I follow while exercising?  Before you start a new exercise program, talk with your health care provider.  Do not exercise so much that you hurt yourself, feel dizzy, or get very short of breath.  Wear comfortable clothes and wear shoes with good support.  Drink plenty of water while you exercise to prevent dehydration or heat stroke.  Work out until your breathing and your heartbeat get faster. Where to find more information  [place] Department of Health and Human Services: [employer]at  Centers for Disease Control and Prevention (CDC): http://www.wolf.info/ Summary  Exercising regularly is important. It will improve your overall fitness, flexibility, and endurance.  Regular exercise also will improve your overall health. It can help you control your weight, reduce stress, and improve your bone density.  Do not exercise so much that you hurt yourself, feel dizzy, or get very short of breath.  Before you start a new exercise program, talk with your health care provider. This information is not intended to replace advice given to you by your health care provider. Make sure you discuss any questions you have with your health care provider. Document Revised: 12/02/2016 Document Reviewed: 11/10/2016 Elsevier Patient Education  2020 [employer].

## 2019-06-04 NOTE — Telephone Encounter (Signed)
[facility] called and stated they received RX for ferrous gluconate 256mg  but that is not available. They only have 240mg . Please call to change prescription. [phone].

## 2019-06-05 NOTE — Telephone Encounter (Signed)
Please see message and advise.  Thank you. ° °

## 2019-06-06 ENCOUNTER — Other Ambulatory Visit: Payer: Self-pay

## 2019-06-06 MED ORDER — FERROUS GLUCONATE 240 (27 FE) MG PO TABS: 240.0000 mg | ORAL_TABLET | Freq: Every day | ORAL | 3 refills | Status: DC

## 2019-06-06 NOTE — Telephone Encounter (Signed)
[facility] called and stated they received RX for ferrous gluconate 256mg  but that is not available. They only have 240mg . Please call to change prescription.  Per Dr. , to change to ferrous gluconate 240mg  1 po q d #90 1 refill.

## 2019-06-06 NOTE — Telephone Encounter (Signed)
Okay to change to ferrous gluconate 240mg  1 po q d #90 1 refill.

## 2019-06-27 ENCOUNTER — Telehealth: Payer: Self-pay | Admitting: Family Medicine

## 2019-06-27 NOTE — Telephone Encounter (Signed)
Patient would like call regarding FMLA paperwork. I told her she could drop off paperwork to be filled out but she would like to speak with someone about them first.

## 2019-07-02 NOTE — Telephone Encounter (Signed)
Patient has dropped off FMLA paperwork and it is in Dr. Evangeline Gula folder in the front office to be filled out.

## 2019-07-03 NOTE — Telephone Encounter (Signed)
Patient is calling and wanted to check the status of FMLA paperwork and also patient is requesting a call back. CB is [phone]

## 2019-07-04 ENCOUNTER — Telehealth: Payer: Self-pay | Admitting: Family Medicine

## 2019-07-04 ENCOUNTER — Other Ambulatory Visit: Payer: Self-pay

## 2019-07-04 NOTE — Telephone Encounter (Signed)
Received a call from the triage nurse for patient to be seen within the next 4 hours, due to no availability at our office offered patient an appointment at another location. Patient declined and stated that she will wait until tomorrow. Also informed patient of going to urgent but patient stated that mother prefers her to be seen at the office due to insurance purposes.

## 2019-07-04 NOTE — Telephone Encounter (Signed)
FYI messages below

## 2019-07-04 NOTE — Telephone Encounter (Signed)
Spoke to patient regarding paperwork and informed patient that an appointment would be needed to discuss further with Dr. Doreene Burke. Patient stated that she wanted to be seen for another issue and requested information about referral to see OBGYN. Provided patient with information regarding referral. Due to patient symptoms call was transferred to the triage for further evaluation and appointment was scheduled.

## 2019-07-04 NOTE — Telephone Encounter (Signed)
Patient called back and said the triage nurse instructed her to keep her appt with Dr. Doreene Burke tomorrow.

## 2019-07-05 ENCOUNTER — Ambulatory Visit: Payer: No Typology Code available for payment source | Admitting: Family Medicine

## 2019-07-05 ENCOUNTER — Telehealth (INDEPENDENT_AMBULATORY_CARE_PROVIDER_SITE_OTHER): Payer: No Typology Code available for payment source | Admitting: Family Medicine

## 2019-07-05 ENCOUNTER — Encounter: Payer: Self-pay | Admitting: Family Medicine

## 2019-07-05 VITALS — Ht 63.0 in

## 2019-07-05 DIAGNOSIS — E611 Iron deficiency: Secondary | ICD-10-CM

## 2019-07-05 DIAGNOSIS — F418 Other specified anxiety disorders: Secondary | ICD-10-CM

## 2019-07-05 DIAGNOSIS — N924 Excessive bleeding in the premenopausal period: Secondary | ICD-10-CM | POA: Insufficient documentation

## 2019-07-05 NOTE — Progress Notes (Signed)
Established Patient Office Visit  Subjective:  Patient ID: Laurie Morrow, female    DOB: January 16, 1995  Age: 24 y.o. MRN: [id number]  CC:  Chief Complaint  Patient presents with  . Fatigue    heavy bleeding x 3 months patient states that she's tired all the time. Would like FMLA forms filled out     HPI Laurie Morrow presents for ongoing weakness and fatigue.  Still reporting menorrhagia.  Iron levels have been low normal.  She has not been able to take the iron like she should she tells me.  She has also been depressed and did ask for referral for counseling.  Has not been able to go for counseling either.  Was referred to GYN back in May and has not been seen as of yet.   Past Medical History:  Diagnosis Date  . Anxiety   . Strep throat     Past Surgical History:  Procedure Laterality Date  . KNEE SURGERY     left    Family History  Problem Relation Age of Onset  . Diabetes Mother   . Hypertension Mother   . Diabetes Maternal Grandmother   . Hypertension Maternal Grandmother   . Hypertension Maternal Grandfather   . Diabetes Maternal Grandfather     Social History   Socioeconomic History  . Marital status: Single    Spouse name: Not on file  . Number of children: Not on file  . Years of education: Not on file  . Highest education level: Not on file  Occupational History  . Occupation: Child psychotherapist  . Occupation: Consulting civil engineer  Tobacco Use  . Smoking status: Former Games developer  . Smokeless tobacco: Never Used  Vaping Use  . Vaping Use: Former  Substance and Sexual Activity  . Alcohol use: Yes    Alcohol/week: 3.0 standard drinks    Types: 3 Glasses of wine per week    Comment: 1 per week  . Drug use: Yes    Types: Marijuana  . Sexual activity: Yes    Birth control/protection: Condom  Other Topics Concern  . Not on file  Social History Narrative  . Not on file   Social Determinants of Health   Financial Resource Strain:   . Difficulty of Paying Living  Expenses:   Food Insecurity:   . Worried About Programme researcher, broadcasting/film/video in the Last Year:   . Barista in the Last Year:   Transportation Needs:   . Freight forwarder (Medical):   Marland Kitchen Lack of Transportation (Non-Medical):   Physical Activity:   . Days of Exercise per Week:   . Minutes of Exercise per Session:   Stress:   . Feeling of Stress :   Social Connections:   . Frequency of Communication with Friends and Family:   . Frequency of Social Gatherings with Friends and Family:   . Attends Religious Services:   . Active Member of Clubs or Organizations:   . Attends Banker Meetings:   Marland Kitchen Marital Status:   Intimate Partner Violence:   . Fear of Current or Ex-Partner:   . Emotionally Abused:   Marland Kitchen Physically Abused:   . Sexually Abused:     Outpatient Medications Prior to Visit  Medication Sig Dispense Refill  . ferrous gluconate (FERGON) 240 (27 FE) MG tablet Take 1 tablet (240 mg total) by mouth daily. 90 tablet 3  . phentermine 30 MG capsule Take 30 mg by mouth every morning. (Patient  not taking: Reported on 07/05/2019)    . vitamin B-12 (CYANOCOBALAMIN) 250 MCG tablet Take 250 mcg by mouth daily. (Patient not taking: Reported on 07/05/2019)     No facility-administered medications prior to visit.    No Known Allergies  ROS Review of Systems  Constitutional: Positive for fatigue.  Respiratory: Negative.   Cardiovascular: Negative.   Gastrointestinal: Negative.   Genitourinary: Positive for menstrual problem.  Psychiatric/Behavioral: Positive for dysphoric mood. The patient is nervous/anxious.    Depression screen [facility] [place] 2/9 07/05/2019 06/04/2019 05/14/2019  Decreased Interest 3 2 3   Down, Depressed, Hopeless 2 1 2   PHQ - 2 Score 5 3 5   Altered sleeping 2 3 2   Tired, decreased energy 3 2 2   Change in appetite 1 1 2   Feeling bad or failure about yourself  0 1 1  Trouble concentrating 3 1 1   Moving slowly or fidgety/restless 0 1 0  Suicidal thoughts 0 0 0    PHQ-9 Score 14 12 13   Difficult doing work/chores Very difficult Very difficult Very difficult      Objective:    Physical Exam Vitals and nursing note reviewed.  Constitutional:      Appearance: Normal appearance. She is not ill-appearing, toxic-appearing or diaphoretic.  Eyes:     General: No scleral icterus.       Right eye: No discharge.        Left eye: No discharge.  Pulmonary:     Effort: Pulmonary effort is normal.  Neurological:     Mental Status: She is alert and oriented to person, place, and time.  Psychiatric:        Mood and Affect: Mood normal.        Behavior: Behavior normal.     Ht 5\' 3"  (1.6 m)   BMI 30.11 kg/m  Wt Readings from Last 3 Encounters:  06/04/19 170 lb (77.1 kg)  05/14/19 169 lb 9.6 oz (76.9 kg)  04/26/19 160 lb (72.6 kg)     Health Maintenance Due  Topic Date Due  . Hepatitis C Screening  Never done  . COVID-19 Vaccine (1) Never done  . PAP-Cervical Cytology Screening  Never done  . PAP SMEAR-Modifier  Never done    There are no preventive care reminders to display for this patient.  Lab Results  Component Value Date   TSH 2.23 06/04/2019   Lab Results  Component Value Date   WBC 5.5 05/14/2019   HGB 10.6 (L) 05/14/2019   HCT 31.6 (L) 05/14/2019   MCV 93.8 05/14/2019   PLT 319.0 05/14/2019   Lab Results  Component Value Date   NA 137 05/14/2019   K 4.4 05/14/2019   CO2 27 05/14/2019   GLUCOSE 100 (H) 05/14/2019   BUN 14 05/14/2019   CREATININE 0.79 05/14/2019   BILITOT 0.5 05/14/2019   ALKPHOS 57 05/14/2019   AST 17 05/14/2019   ALT 16 05/14/2019   PROT 6.7 05/14/2019   ALBUMIN 3.8 05/14/2019   CALCIUM 9.2 05/14/2019   GFR 107.08 05/14/2019   Lab Results  Component Value Date   CHOL 158 05/14/2019   Lab Results  Component Value Date   HDL 59.70 05/14/2019   Lab Results  Component Value Date   LDLCALC 85 05/14/2019   Lab Results  Component Value Date   TRIG 69.0 05/14/2019   Lab Results   Component Value Date   CHOLHDL 3 05/14/2019   No results found for: HGBA1C    Assessment & Plan:  Problem List Items Addressed This Visit      Other   Depression with anxiety   Relevant Orders   Ambulatory referral to Psychology   Iron deficiency - Primary   Excessive bleeding in premenopausal period   Relevant Orders   Ambulatory referral to Obstetrics / Gynecology      No orders of the defined types were placed in this encounter.   Follow-up: No follow-ups on file.  Complains of progressive weakness and fatigue.  Hemoglobin measured at 10.76-month ago.  Has not been able to take iron.  Menorrhagia persists.  Has not been able to go for counseling or see GYN.  Advised patient to be seen in the emergency room.  Will refer her for counseling and GYN follow-up.  Mliss Sax, MD   Virtual Visit via Video Note  I connected with Laurie Morrow on 07/05/19 at  4:00 PM EDT by a video enabled telemedicine application and verified that I am speaking with the correct person using two identifiers.  Location: Patient: home with family  Provider:    I discussed the limitations of evaluation and management by telemedicine and the availability of in person appointments. The patient expressed understanding and agreed to proceed.  History of Present Illness:    Observations/Objective:   Assessment and Plan:   Follow Up Instructions:    I discussed the assessment and treatment plan with the patient. The patient was provided an opportunity to ask questions and all were answered. The patient agreed with the plan and demonstrated an understanding of the instructions.   The patient was advised to call back or seek an in-person evaluation if the symptoms worsen or if the condition fails to improve as anticipated.  I provided 20 minutes of non-face-to-face time during this encounter.   Mliss Sax, MD

## 2019-07-11 ENCOUNTER — Telehealth: Payer: No Typology Code available for payment source | Admitting: Family Medicine

## 2019-07-11 NOTE — Telephone Encounter (Signed)
Form filled out patient aware and will pick up
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
## 2019-07-25 ENCOUNTER — Telehealth: Payer: Self-pay

## 2019-07-25 DIAGNOSIS — E611 Iron deficiency: Secondary | ICD-10-CM

## 2019-07-25 MED ORDER — IRON (FERROUS SULFATE) 325 (65 FE) MG PO TABS: ORAL_TABLET | ORAL | 1 refills | Status: DC

## 2019-07-25 NOTE — Telephone Encounter (Signed)
Fax from [facility] stating that the ferrous gluconate is not available.   Can this be changed to an alternative? Please advise.

## 2019-07-25 NOTE — Telephone Encounter (Signed)
Sent Rx for iron sulfate to her pharmacy. I would take it with a stool softner such as colace.

## 2019-07-26 NOTE — Addendum Note (Signed)
Addended by: Verlan Friends on: 07/26/2019 09:45 AM   Modules accepted: Orders

## 2019-07-26 NOTE — Telephone Encounter (Addendum)
LVM with patient informing of the change and the recommended.   Med list updated.

## 2019-09-03 ENCOUNTER — Ambulatory Visit: Payer: No Typology Code available for payment source | Admitting: Family Medicine

## 2019-09-05 ENCOUNTER — Telehealth: Payer: Self-pay | Admitting: Family Medicine

## 2019-09-05 ENCOUNTER — Encounter: Payer: Self-pay | Admitting: Family Medicine

## 2019-09-05 NOTE — Telephone Encounter (Signed)
Pt was no show for appt 09/03/19. 1st occurrence. Fee waived Letter mailed.  PCP,  Please reply back with corresponding letter matching appropriate follow up needs.  A - No follow up necessary B - Follow up urgent - locate patient immediately to schedule appointment. C - Follow up necessary. Contact patient and schedule visit w/in 7 days. D - Follow up necessary. Contact patient and schedule visit w/in 2-4 weeks.  E - Follow up necessary. Contact patient and schedule visit w/in 3 months.

## 2020-01-02 ENCOUNTER — Encounter: Payer: No Typology Code available for payment source | Admitting: Obstetrics & Gynecology

## 2020-12-15 ENCOUNTER — Ambulatory Visit
Admission: EM | Admit: 2020-12-15 | Discharge: 2020-12-15 | Disposition: A | Discharge status: Home / Self Care | Payer: No Typology Code available for payment source | Attending: Emergency Medicine | Admitting: Emergency Medicine

## 2020-12-15 DIAGNOSIS — J020 Streptococcal pharyngitis: Secondary | ICD-10-CM

## 2020-12-15 DIAGNOSIS — J029 Acute pharyngitis, unspecified: Secondary | ICD-10-CM

## 2020-12-15 LAB — POCT RAPID STREP A (OFFICE): Rapid Strep A Screen: POSITIVE — AB

## 2020-12-15 MED ORDER — PENICILLIN G BENZATHINE 1200000 UNIT/2ML IM SUSY
1.2000 10*6.[IU] | PREFILLED_SYRINGE | Freq: Once | INTRAMUSCULAR | Status: AC
  Administered 2020-12-15: 1.2 10*6.[IU] via INTRAMUSCULAR

## 2020-12-15 NOTE — Discharge Instructions (Addendum)
Your rapid strep test today was positive.  You received 1 injection of penicillin in the office today which should completely resolve your infection.  Please do take some time to consider having her tonsils removed if this is recurrent and distressing problem for you.  Surgery is not easy, the recovery is tough but the end result would be amazing.

## 2020-12-15 NOTE — ED Provider Notes (Signed)
[facility]    CSN: [id number] Arrival date & time: 12/15/20  1244    HISTORY  No chief complaint on file.  HPI Laurie Morrow is a 25 y.o. female. Patient states she has a very sore throat, states she has a history of recurrent strep throat, feels that she has strep throat at this time.  Of note, rapid strep test today in the office is positive.  Patient states she frequently gets these infections, states she has been advised many times to have her tonsils removed but is very hesitant to do so, states she has a lot of uncertainty and questions about having surgery.  The history is provided by the patient.  Past Medical History:  Diagnosis Date   Anxiety    Strep throat    Patient Active Problem List   Diagnosis Date Noted   Excessive bleeding in premenopausal period 07/05/2019   Iron deficiency 06/04/2019   B12 deficiency 05/17/2019   Need for Tdap vaccination 05/17/2019   Elevated TSH 05/17/2019   DUB (dysfunctional uterine bleeding) 05/14/2019   Depression with anxiety 05/14/2019   Acute pain of right knee 07/10/2017   Healthcare maintenance 11/03/2015   Past Surgical History:  Procedure Laterality Date   KNEE SURGERY     left   OB History   No obstetric history on file.    Home Medications    Prior to Admission medications   Medication Sig Start Date End Date Taking? Authorizing Provider  Iron, Ferrous Sulfate, 325 (65 Fe) MG TABS Take one daily 07/25/19   Mliss Sax, MD   Family History Family History  Problem Relation Age of Onset   Diabetes Mother    Hypertension Mother    Diabetes Maternal Grandmother    Hypertension Maternal Grandmother    Hypertension Maternal Grandfather    Diabetes Maternal Grandfather    Social History Social History   Tobacco Use   Smoking status: Former   Smokeless tobacco: Never  Building services engineer Use: Former  Substance Use Topics   Alcohol use: Yes    Alcohol/week: 3.0 standard drinks     Types: 3 Glasses of wine per week    Comment: 1 per week   Drug use: Yes    Types: Marijuana   Allergies   Patient has no known allergies.  Review of Systems Review of Systems Pertinent findings noted in history of present illness.   Physical Exam Triage Vital Signs ED Triage Vitals  Enc Vitals Group     BP 10/30/20 0827 (!) 147/82     Pulse Rate 10/30/20 0827 72     Resp 10/30/20 0827 18     Temp 10/30/20 0827 98.3 F (36.8 C)     Temp Source 10/30/20 0827 Oral     SpO2 10/30/20 0827 98 %     Weight --      Height --      Head Circumference --      Peak Flow --      Pain Score 10/30/20 0826 5     Pain Loc --      Pain Edu? --      Excl. in GC? --   No data found.  Updated Vital Signs BP 117/69 (BP Location: Left Arm)    Pulse 75    Temp 98.7 F (37.1 C) (Oral)    Resp 18    LMP 12/01/2020 (Approximate)    SpO2 97%   Physical Exam Constitutional:  General: She is not in acute distress.    Appearance: She is well-developed and well-groomed. She is ill-appearing.  HENT:     Head: Normocephalic and atraumatic.     Jaw: No trismus, tenderness, swelling or pain on movement.     Salivary Glands: Right salivary gland is diffusely enlarged and tender. Left salivary gland is diffusely enlarged and tender.     Right Ear: Tympanic membrane normal.     Left Ear: Tympanic membrane normal.     Ears:     Comments: Bilateral EACs with mild erythema, bilateral TMs normal    Nose: Nose normal.     Right Turbinates: Not enlarged or swollen.     Left Turbinates: Not enlarged or swollen.     Right Sinus: No maxillary sinus tenderness or frontal sinus tenderness.     Left Sinus: No maxillary sinus tenderness or frontal sinus tenderness.     Mouth/Throat:     Comments: Posterior pharynx with diffuse erythema, mild exudate.  Both tonsils enlarged with erythema and exudate Eyes:     General: Lids are normal. Vision grossly intact.     Extraocular Movements: Extraocular  movements intact.     Conjunctiva/sclera: Conjunctivae normal.  Cardiovascular:     Rate and Rhythm: Normal rate and regular rhythm.     Pulses: Normal pulses.     Heart sounds: Normal heart sounds, S1 normal and S2 normal. Heart sounds not distant. No murmur heard.   No friction rub. No gallop. No S3 or S4 sounds.  Pulmonary:     Effort: Pulmonary effort is normal.     Breath sounds: Normal breath sounds. No decreased breath sounds, wheezing, rhonchi or rales.  Musculoskeletal:        General: Normal range of motion.     Right lower leg: No edema.     Left lower leg: No edema.  Lymphadenopathy:     Cervical: Cervical adenopathy present.  Skin:    General: Skin is warm and dry.  Neurological:     General: No focal deficit present.     Mental Status: She is alert and oriented to person, place, and time.  Psychiatric:        Mood and Affect: Mood normal.        Behavior: Behavior normal.    Visual Acuity Right Eye Distance:   Left Eye Distance:   Bilateral Distance:    Right Eye Near:   Left Eye Near:    Bilateral Near:     UC Couse / Diagnostics / Procedures:    EKG  Radiology No results found.  Procedures Procedures (including critical care time)  UC Diagnoses / Final Clinical Impressions(s)   I have reviewed the triage vital signs and the nursing notes.  Pertinent labs & imaging results that were available during my care of the patient were reviewed by me and considered in my medical decision making (see chart for details).   Final diagnoses:  Acute pharyngitis, unspecified etiology  Streptococcal pharyngitis   Rapid strep test today was positive.  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  Penicillin provided.  Patient states she tolerated this in the past.  Return precautions advised.  Note provided for work.  Patient given information about tonsillectomy for her review.  ED Prescriptions   None    PDMP not  reviewed this encounter.  Pending results:  Labs Reviewed  POCT RAPID STREP A (OFFICE) - Abnormal; Notable for the following components:  Result Value   Rapid Strep A Screen Positive (*)    All other components within normal limits    Medications Ordered in UC: Medications  penicillin g benzathine (BICILLIN LA) 1200000 UNIT/2ML injection 1.2 Million Units (1.2 Million Units Intramuscular Given 12/15/20 1507)    Disposition Upon Discharge:  Condition: stable for discharge home Home: take medications as prescribed; routine discharge instructions as discussed; follow up as advised.  Patient presented with an acute illness with associated systemic symptoms and significant discomfort requiring urgent management. In my opinion, this is a condition that a prudent lay person (someone who possesses an average knowledge of health and medicine) may potentially expect to result in complications if not addressed urgently such as respiratory distress, impairment of bodily function or dysfunction of bodily organs.   Routine symptom specific, illness specific and/or disease specific instructions were discussed with the patient and/or caregiver at length.   As such, the patient has been evaluated and assessed, work-up was performed and treatment was provided in alignment with urgent care protocols and evidence based medicine.  Patient/parent/caregiver has been advised that the patient may require follow up for further testing and treatment if the symptoms continue in spite of treatment, as clinically indicated and appropriate.  The patient was tested for COVID-19, Influenza and/or RSV, then the patient/parent/guardian was advised to isolate at home pending the results of his/her diagnostic coronavirus test and potentially longer if theyre positive. I have also advised pt that if his/her COVID-19 test returns positive, it's recommended to self-isolate for at least 10 days after symptoms first appeared  AND until fever-free for 24 hours without fever reducer AND other symptoms have improved or resolved. Discussed self-isolation recommendations as well as instructions for household member/close contacts as per the [facility] and [place] DHHS, and also gave patient the COVID packet with this information.  Patient/parent/caregiver has been advised to return to the [facility] or PCP in 3-5 days if no better; to PCP or the Emergency Department if new signs and symptoms develop, or if the current signs or symptoms continue to change or worsen for further workup, evaluation and treatment as clinically indicated and appropriate  The patient will follow up with their current PCP if and as advised. If the patient does not currently have a PCP we will assist them in obtaining one.   The patient may need specialty follow up if the symptoms continue, in spite of conservative treatment and management, for further workup, evaluation, consultation and treatment as clinically indicated and appropriate.  Patient/parent/caregiver verbalized understanding and agreement of plan as discussed.  All questions were addressed during visit.  Please see discharge instructions below for further details of plan.  Discharge Instructions:   Discharge Instructions      Your rapid strep test today was positive.  You received 1 injection of penicillin in the office today which should completely resolve your infection.  Please do take some time to consider having her tonsils removed if this is recurrent and distressing problem for you.  Surgery is not easy, the recovery is tough but the end result would be amazing.       Theadora Rama Scales, PA-C 12/15/20 1510

## 2020-12-15 NOTE — ED Triage Notes (Signed)
Pt states she has a sore throat and believes it is strep throat.

## 2022-05-23 ENCOUNTER — Encounter (HOSPITAL_COMMUNITY): Payer: Self-pay | Admitting: Emergency Medicine

## 2022-05-23 ENCOUNTER — Other Ambulatory Visit: Payer: Self-pay

## 2022-05-23 ENCOUNTER — Emergency Department (HOSPITAL_COMMUNITY)
Admission: EM | Admit: 2022-05-23 | Discharge: 2022-05-23 | Disposition: A | Discharge status: Home / Self Care | Payer: Medicaid Other | Attending: Emergency Medicine | Admitting: Emergency Medicine

## 2022-05-23 ENCOUNTER — Emergency Department (HOSPITAL_COMMUNITY): Payer: Medicaid Other

## 2022-05-23 DIAGNOSIS — N840 Polyp of corpus uteri: Secondary | ICD-10-CM | POA: Insufficient documentation

## 2022-05-23 DIAGNOSIS — N939 Abnormal uterine and vaginal bleeding, unspecified: Secondary | ICD-10-CM | POA: Diagnosis present

## 2022-05-23 LAB — CBC WITH DIFFERENTIAL/PLATELET
Abs Immature Granulocytes: 0.01 10*3/uL (ref 0.00–0.07)
Basophils Absolute: 0 10*3/uL (ref 0.0–0.1)
Basophils Relative: 0 %
Eosinophils Absolute: 0.1 10*3/uL (ref 0.0–0.5)
Eosinophils Relative: 1 %
HCT: 39.2 % (ref 36.0–46.0)
Hemoglobin: 12.4 g/dL (ref 12.0–15.0)
Immature Granulocytes: 0 %
Lymphocytes Relative: 59 %
Lymphs Abs: 3.5 10*3/uL (ref 0.7–4.0)
MCH: 31.2 pg (ref 26.0–34.0)
MCHC: 31.6 g/dL (ref 30.0–36.0)
MCV: 98.5 fL (ref 80.0–100.0)
Monocytes Absolute: 0.3 10*3/uL (ref 0.1–1.0)
Monocytes Relative: 6 %
Neutro Abs: 2 10*3/uL (ref 1.7–7.7)
Neutrophils Relative %: 34 %
Platelets: 277 10*3/uL (ref 150–400)
RBC: 3.98 MIL/uL (ref 3.87–5.11)
RDW: 13.8 % (ref 11.5–15.5)
WBC: 5.9 10*3/uL (ref 4.0–10.5)
nRBC: 0 % (ref 0.0–0.2)

## 2022-05-23 LAB — BASIC METABOLIC PANEL
Anion gap: 9 (ref 5–15)
BUN: 13 mg/dL (ref 6–20)
CO2: 22 mmol/L (ref 22–32)
Calcium: 8.7 mg/dL — ABNORMAL LOW (ref 8.9–10.3)
Chloride: 107 mmol/L (ref 98–111)
Creatinine, Ser: 0.85 mg/dL (ref 0.44–1.00)
GFR, Estimated: >60 mL/min (ref >60)
Glucose, Bld: 83 mg/dL (ref 70–99)
Potassium: 3.7 mmol/L (ref 3.5–5.1)
Sodium: 138 mmol/L (ref 135–145)

## 2022-05-23 LAB — HCG, QUANTITATIVE, PREGNANCY: hCG, Beta Chain, Quant, S: <1 m[IU]/mL (ref <5)

## 2022-05-23 NOTE — ED Provider Notes (Signed)
[place] EMERGENCY DEPARTMENT AT [facility] Provider Note   CSN: [id number] Arrival date & time: 05/23/22  1549     History  Chief Complaint  Patient presents with   Vaginal Bleeding    Laurie Morrow is a 27 y.o. female history of dysfunctional uterine bleeding vaginal bleeding with clots for the past 3 weeks.  Patient states that every time she has intercourse she knows blood clots vaginal bleeding.  Patient states that she begins to feel fatigued.  patient does not see gynecology.  patient does not believe she is pregnant but also does not know when her last period was as her periods are irregular.   Patient denied chest pain, shortness of breath, fevers, nausea/vomiting, bowel changes, dysuria  Home Medications Prior to Admission medications   Medication Sig Start Date End Date Taking? Authorizing Provider  Iron, Ferrous Sulfate, 325 (65 Fe) MG TABS Take one daily 07/25/19   Mliss Sax, MD      Allergies    Patient has no known allergies.    Review of Systems   Review of Systems  Genitourinary:  Positive for vaginal bleeding.    Physical Exam Updated Vital Signs BP 126/78   Pulse 98   Temp 98.5 F (36.9 C) (Oral)   Resp 16   Ht 5\' 3"  (1.6 m)   Wt 77 kg   SpO2 99%   BMI 30.07 kg/m  Physical Exam Vitals reviewed.  Constitutional:      General: She is not in acute distress. HENT:     Head: Normocephalic and atraumatic.  Eyes:     Extraocular Movements: Extraocular movements intact.     Conjunctiva/sclera: Conjunctivae normal.     Pupils: Pupils are equal, round, and reactive to light.  Cardiovascular:     Rate and Rhythm: Normal rate and regular rhythm.     Pulses: Normal pulses.     Heart sounds: Normal heart sounds.     Comments: 2+ bilateral radial/dorsalis pedis pulses with regular rate Pulmonary:     Effort: Pulmonary effort is normal. No respiratory distress.     Breath sounds: Normal breath sounds.  Abdominal:      Palpations: Abdomen is soft.     Tenderness: There is no abdominal tenderness. There is no guarding or rebound.  Musculoskeletal:        General: Normal range of motion.     Cervical back: Normal range of motion and neck supple.     Comments: 5 out of 5 bilateral grip/leg extension strength  Skin:    General: Skin is warm and dry.     Capillary Refill: Capillary refill takes less than 2 seconds.  Neurological:     General: No focal deficit present.     Mental Status: She is alert and oriented to person, place, and time.     Comments: Sensation intact in all 4 limbs  Psychiatric:        Mood and Affect: Mood normal.     ED Results / Procedures / Treatments   Labs (all labs ordered are listed, but only abnormal results are displayed) Labs Reviewed  BASIC METABOLIC PANEL - Abnormal; Notable for the following components:      Result Value   Calcium 8.7 (*)    All other components within normal limits  CBC WITH DIFFERENTIAL/PLATELET  HCG, QUANTITATIVE, PREGNANCY  URINALYSIS, ROUTINE W REFLEX MICROSCOPIC    EKG None  Radiology US PELVIC COMPLETE W TRANSVAGINAL AND TORSION R/O  Result Date: 05/23/2022 CLINICAL DATA:  Dysfunctional uterine bleeding, unknown LMP EXAM: TRANSABDOMINAL AND TRANSVAGINAL ULTRASOUND OF PELVIS DOPPLER ULTRASOUND OF OVARIES TECHNIQUE: Both transabdominal and transvaginal ultrasound examinations of the pelvis were performed. Transabdominal technique was performed for global imaging of the pelvis including uterus, ovaries, adnexal regions, and pelvic cul-de-sac. It was necessary to proceed with endovaginal exam following the transabdominal exam to visualize the ovaries bilaterally and endometrium. Color and duplex Doppler ultrasound was utilized to evaluate blood flow to the ovaries. COMPARISON:  CT and pelvic sonogram 04/27/2012 FINDINGS: Uterus Measurements: 7.0 x 3.8 x 4.3 cm = volume: 58 mL. The uterus is anteverted. The cervix is closed. A trace amount of  simple appearing free fluid is seen within the endometrial cavity within the lower uterine segment extending into the internal cervical os. No intrauterine masses are seen. Endometrium Thickness: 9 mm. There is a lobulated heterogeneously hyper and hypoechoic mass seen within the endometrial cavity within the fundus measuring 11 x 6 mm best seen on image # [id number] suspicious for an endometrial polyp. Less likely this may represent an intracavitary uterine fibroid. Right ovary Measurements: 3.3 x 2.8 x 3.0 cm = volume: 15 mL. Normal appearance/no adnexal mass. Left ovary Measurements: 2.4 x 1.7 x 1.8 cm = volume: 4 mL. Normal appearance/no adnexal mass. Pulsed Doppler evaluation of both ovaries demonstrates normal low-resistance arterial and venous waveforms. Other findings No abnormal free fluid. IMPRESSION: 1. 11 x 6 mm lobulated heterogeneously hyper and hypoechoic mass within the endometrial cavity within the fundus suspicious for an endometrial polyp. Correlation with sonohysterography or hysteroscopy is recommended for further evaluation. Electronically Signed   By: Helyn Numbers M.D.   On: 05/23/2022 19:29    Procedures Procedures    Medications Ordered in ED Medications - No data to display  ED Course/ Medical Decision Making/ A&P                             Medical Decision Making Amount and/or Complexity of Data Reviewed Labs: ordered. Radiology: ordered.   Laurie Morrow 27 y.o. presented today for vaginal bleeding. Working DDx that I considered at this time includes, but not limited to, AUB, menstrual period, pregnancy, ovarian torsion, ectopic pregnancy, miscarriage.  R/o DDx: menstrual period, pregnancy, ovarian torsion, ectopic pregnancy, miscarriage: These are considered less likely due to history of present illness and physical exam findings  Review of prior external notes: 12/15/2020 ED  Unique Tests and My Interpretation: Pelvic ultrasound: Endometrial polyp noted BMP:  Unremarkable hCG: Negative CBC with differential: Unremarkable  Discussion with Independent Historian: None  Discussion of Management of Tests: None  Risk: Low: based on diagnostic testing/clinical impression and treatment plan  Risk Stratification Score: None  Plan: Patient presented for vaginal bleeding. On exam patient was in no acute distress and stable vitals.  Patient physical exam was unremarkable.  Labs and ultrasound were ordered.  Labs were all reassuring and patient is not pregnant.  Ultrasound did show endometrial polyp which would explain patient's abnormal uterine bleeding.  Patient states she does not see GYN as she recently just left when she saw and needs a referral.  I spoke to the patient about how she may use Tylenol or ibuprofen every 6 hours as needed for pain and that she would be given GYN referral at time of discharge.  Low suspicion for any life-threatening diagnoses at this time.  Patient was given return precautions. Patient stable for discharge  at this time.  Patient verbalized understanding of plan.         Final Clinical Impression(s) / ED Diagnoses Final diagnoses:  Endometrial polyp    Rx / DC Orders ED Discharge Orders     None         Remi Deter 05/23/22 Margarette Asal, MD 05/24/22 1736

## 2022-05-23 NOTE — ED Notes (Signed)
Pt verbalized understanding of discharge instructions. Pt ambulated from ed with steady gait. Pt dressed for discharge.

## 2022-05-23 NOTE — ED Triage Notes (Signed)
Patient report vaginal bleeding x 3 weeks. Pt report worsening vaginal bleeding for past few days. Pt report weakness and fatigue. Pt denies N/V. Pt a/ox4.

## 2022-05-23 NOTE — Discharge Instructions (Signed)
Please follow-up with the OB GYN I have attached your for you regarding recent symptoms and ER visit.  Today ultrasound showed the an endometrial polyp causing your abnormal uterine bleeding.  You may take Tylenol or ibuprofen every 6 hours as needed for pain.  If symptoms worsen please return to ER.

## 2022-05-23 NOTE — ED Provider Triage Note (Signed)
Emergency Medicine Provider Triage Evaluation Note  CAETLIN Morrow , a 27 y.o. female  was evaluated in triage.  Pt complains of vaginal bleeding with clots for the past 3 weeks.  Patient states that every time she has intercourse she knows blood clots vaginal bleeding.  Patient states that she begins to feel fatigued.  patient does not see gynecology.  patient does not believe she is pregnant but also does not know when her last period was as her periods are irregular.  Patient denied chest pain, shortness of breath, fevers, nausea/vomiting, bowel changes, dysuria  Review of Systems  Positive: See HPI Negative: HPI  Physical Exam  BP 131/75 (BP Location: Left Arm)   Pulse 62   Temp 98.3 F (36.8 C) (Oral)   Resp 16   Ht 5\' 3"  (1.6 m)   Wt 77 kg   SpO2 98%   BMI 30.07 kg/m  Gen:   Awake, no distress   Resp:  Normal effort  MSK:   Moves extremities without difficulty  Other:  No abdominal tenderness or peritoneal signs noted  Medical Decision Making  Medically screening exam initiated at 4:53 PM.  Appropriate orders placed.  Laurie Morrow was informed that the remainder of the evaluation will be completed by another provider, this initial triage assessment does not replace that evaluation, and the importance of remaining in the ED until their evaluation is complete.  Workup initiated, patient stable at this time   Laurie Morrow 05/23/22 1655

## 2022-06-08 ENCOUNTER — Other Ambulatory Visit (HOSPITAL_COMMUNITY)
Admission: RE | Admit: 2022-06-08 | Discharge: 2022-06-08 | Disposition: A | Discharge status: Home / Self Care | Payer: Medicaid Other | Source: Ambulatory Visit | Attending: Obstetrics & Gynecology | Admitting: Obstetrics & Gynecology

## 2022-06-08 ENCOUNTER — Encounter: Payer: Self-pay | Admitting: Obstetrics & Gynecology

## 2022-06-08 ENCOUNTER — Ambulatory Visit (INDEPENDENT_AMBULATORY_CARE_PROVIDER_SITE_OTHER): Payer: Medicaid Other | Admitting: Obstetrics & Gynecology

## 2022-06-08 VITALS — BP 115/77 | HR 79 | Ht 62.0 in | Wt 174.0 lb

## 2022-06-08 DIAGNOSIS — N840 Polyp of corpus uteri: Secondary | ICD-10-CM

## 2022-06-08 DIAGNOSIS — B9689 Other specified bacterial agents as the cause of diseases classified elsewhere: Secondary | ICD-10-CM

## 2022-06-08 DIAGNOSIS — N939 Abnormal uterine and vaginal bleeding, unspecified: Secondary | ICD-10-CM | POA: Insufficient documentation

## 2022-06-08 DIAGNOSIS — Z113 Encounter for screening for infections with a predominantly sexual mode of transmission: Secondary | ICD-10-CM | POA: Diagnosis present

## 2022-06-08 DIAGNOSIS — N760 Acute vaginitis: Secondary | ICD-10-CM

## 2022-06-08 HISTORY — DX: Polyp of corpus uteri: N84.0

## 2022-06-08 NOTE — Patient Instructions (Signed)
Hysteroscopy (looking into uterus), Dilation (opening of uterus) and Curettage (taking cells from inside your uterus), Polypectomy (removal of polypectomy)

## 2022-06-08 NOTE — Progress Notes (Signed)
GYNECOLOGY OFFICE VISIT NOTE  History:   Laurie Morrow is a 27 y.o. G0P0000 here today for discussion of management of abnormal uterine bleeding and recently diagnosed endometrial polyp.  Reports having periods that can last up to 2 weeks every month for the past 3-4 months, sometimes with light bleeding, other times with heavy bleeding.  Was evaluated for this in the ED on 05/23/22, her hemoglobin was 12.4, HCG  <1 and  ultrasound showed 11 mm endometrial polyp.  She desires removal of this polyp.  Also desires STI screen today.  She denies any pelvic pain or other concerns.    Past Medical History:  Diagnosis Date   Abnormal uterine bleeding (AUB) 05/14/2019   Anxiety    B12 deficiency 05/17/2019   Depression with anxiety 05/14/2019   Iron deficiency 06/04/2019   Strep throat     Past Surgical History:  Procedure Laterality Date   KNEE SURGERY     left    The following portions of the patient's history were reviewed and updated as appropriate: allergies, current medications, past family history, past medical history, past social history, past surgical history and problem list.   Health Maintenance:  Does not recall when her last pap was, but it was normal.  Review of Systems:  Pertinent items noted in HPI and remainder of comprehensive ROS otherwise negative.  Physical Exam:  BP 115/77   Pulse 79   Ht 5\' 2"  (1.575 m)   Wt 174 lb (78.9 kg)   LMP  (LMP Unknown)   BMI 31.83 kg/m  CONSTITUTIONAL: Well-developed, well-nourished female in no acute distress.  HEENT:  Normocephalic, atraumatic. External right and left ear normal. No scleral icterus.  NECK: Normal range of motion, supple, no masses noted on observation SKIN: No rash noted. Not diaphoretic. No erythema. No pallor. MUSCULOSKELETAL: Normal range of motion. No edema noted. NEUROLOGIC: Alert and oriented to person, place, and time. Normal muscle tone coordination. No cranial nerve deficit noted. PSYCHIATRIC:  Normal mood and affect. Normal behavior. Normal judgment and thought content. CARDIOVASCULAR: Normal heart rate noted RESPIRATORY: Effort and breath sounds normal, no problems with respiration noted ABDOMEN: No masses noted. No other overt distention noted.   PELVIC: Normal appearing external genitalia; normal urethral meatus; normal appearing vaginal mucosa and cervix.  Pap smear done.  Brown discharge noted, testing sample obtained.  Normal uterine size, no other palpable masses, no uterine or adnexal tenderness. Performed in the presence of a chaperone  Labs and Imaging    Latest Ref Rng & Units 05/23/2022    4:59 PM 05/14/2019    9:45 AM 11/03/2015    4:04 PM  CBC  WBC 4.0 - 10.5 K/uL 5.9  5.5  5.7   Hemoglobin 12.0 - 15.0 g/dL 96.0  45.4  09.8   Hematocrit 36.0 - 46.0 % 39.2  31.6  36.8   Platelets 150 - 400 K/uL 277  319.0  311.0    US PELVIC COMPLETE W TRANSVAGINAL AND TORSION R/O  Result Date: 05/23/2022 CLINICAL DATA:  Dysfunctional uterine bleeding, unknown LMP EXAM: TRANSABDOMINAL AND TRANSVAGINAL ULTRASOUND OF PELVIS DOPPLER ULTRASOUND OF OVARIES TECHNIQUE: Both transabdominal and transvaginal ultrasound examinations of the pelvis were performed. Transabdominal technique was performed for global imaging of the pelvis including uterus, ovaries, adnexal regions, and pelvic cul-de-sac. It was necessary to proceed with endovaginal exam following the transabdominal exam to visualize the ovaries bilaterally and endometrium. Color and duplex Doppler ultrasound was utilized to evaluate blood flow to the  ovaries. COMPARISON:  CT and pelvic sonogram 04/27/2012 FINDINGS: Uterus Measurements: 7.0 x 3.8 x 4.3 cm = volume: 58 mL. The uterus is anteverted. The cervix is closed. A trace amount of simple appearing free fluid is seen within the endometrial cavity within the lower uterine segment extending into the internal cervical os. No intrauterine masses are seen. Endometrium Thickness: 9 mm. There  is a lobulated heterogeneously hyper and hypoechoic mass seen within the endometrial cavity within the fundus measuring 11 x 6 mm best seen on image # [id number] suspicious for an endometrial polyp. Less likely this may represent an intracavitary uterine fibroid. Right ovary Measurements: 3.3 x 2.8 x 3.0 cm = volume: 15 mL. Normal appearance/no adnexal mass. Left ovary Measurements: 2.4 x 1.7 x 1.8 cm = volume: 4 mL. Normal appearance/no adnexal mass. Pulsed Doppler evaluation of both ovaries demonstrates normal low-resistance arterial and venous waveforms. Other findings No abnormal free fluid. IMPRESSION: 1. 11 x 6 mm lobulated heterogeneously hyper and hypoechoic mass within the endometrial cavity within the fundus suspicious for an endometrial polyp. Correlation with sonohysterography or hysteroscopy is recommended for further evaluation. Electronically Signed   By: Helyn Numbers M.D.   On: 05/23/2022 19:29       Assessment and Plan:     1. Routine screening for STI (sexually transmitted infection) STI screen done, will follow up results and manage accordingly. - Cervicovaginal ancillary only - RPR+HBsAg+HCVAb+HIV  2. Endometrial polyp 3. Abnormal uterine bleeding (AUB) Labs and pap done as part of evaluation for her AUB, will follow up results and manage accordingly. - Cytology - PAP( [place]) - TSH+Prl+TestT+TestF+17OHP - Cervicovaginal ancillary only Reviewed ultrasound findings, endometrial polyp can be associated with AUB.  Patient was offered Hysteroscopy, Dilation and Curettage and Polypectomy.  Details of procedure discussed.  Risks of surgery were discussed with the patient including but not limited to: bleeding which may require transfusion; infection which may require antibiotics; injury to uterus or surrounding organs; intrauterine scarring which may impair future fertility; need for additional procedures including laparotomy or laparoscopy secondary to intraoperative injury or  abnormal pathology; and other postoperative/anesthesia complication.  Patient was told that the likelihood that her condition and symptoms will be treated effectively with this surgical management was very high; the postoperative expectations were also discussed in detail. The patient also understands the alternative treatment options which were discussed in full; she declined medical/hormonal management as an interim treatment or alternative treatment. All questions were answered.  She was told that she will be contacted by our surgical scheduler regarding the time and date of her surgery; routine preoperative instructions will be given to her by the preoperative nursing team. Printed patient education handouts about the procedure were given to the patient to review at home.  Please refer to After Visit Summary for other counseling recommendations.   Return for follow up as recommended.    I spent 45 minutes dedicated to the care of this patient including pre-visit review of records, face to face time with the patient discussing her conditions and treatments and post visit orders.    Jaynie Collins, MD, FACOG Obstetrician & Gynecologist, [facility] for [employer], [facility]

## 2022-06-09 LAB — CERVICOVAGINAL ANCILLARY ONLY
Bacterial Vaginitis (gardnerella): POSITIVE — AB
Candida Glabrata: NEGATIVE
Candida Vaginitis: NEGATIVE
Chlamydia: NEGATIVE
Comment: NEGATIVE
Comment: NEGATIVE
Comment: NEGATIVE
Comment: NEGATIVE
Comment: NEGATIVE
Comment: NORMAL
Neisseria Gonorrhea: NEGATIVE
Trichomonas: NEGATIVE

## 2022-06-09 MED ORDER — METRONIDAZOLE 500 MG PO TABS
500.0000 mg | ORAL_TABLET | Freq: Two times a day (BID) | ORAL | 0 refills | Status: AC
Start: 2022-06-09 — End: 2022-06-16

## 2022-06-09 NOTE — Addendum Note (Signed)
Addended by: Jaynie Collins A on: 06/09/2022 04:20 PM   Modules accepted: Orders

## 2022-06-13 ENCOUNTER — Emergency Department (HOSPITAL_COMMUNITY)
Admission: EM | Admit: 2022-06-13 | Discharge: 2022-06-13 | Discharge status: Against Medical Advice | Payer: Medicaid Other | Attending: Emergency Medicine | Admitting: Emergency Medicine

## 2022-06-13 ENCOUNTER — Emergency Department (HOSPITAL_COMMUNITY)
Admission: EM | Admit: 2022-06-13 | Discharge: 2022-06-14 | Disposition: A | Discharge status: Home / Self Care | Payer: Medicaid Other | Source: Home / Self Care | Attending: Emergency Medicine | Admitting: Emergency Medicine

## 2022-06-13 ENCOUNTER — Encounter (HOSPITAL_COMMUNITY): Payer: Self-pay

## 2022-06-13 DIAGNOSIS — R52 Pain, unspecified: Secondary | ICD-10-CM | POA: Diagnosis present

## 2022-06-13 DIAGNOSIS — Z5321 Procedure and treatment not carried out due to patient leaving prior to being seen by health care provider: Secondary | ICD-10-CM | POA: Insufficient documentation

## 2022-06-13 DIAGNOSIS — N10 Acute tubulo-interstitial nephritis: Secondary | ICD-10-CM | POA: Insufficient documentation

## 2022-06-13 LAB — URINALYSIS, ROUTINE W REFLEX MICROSCOPIC
Bilirubin Urine: NEGATIVE
Glucose, UA: NEGATIVE mg/dL
Ketones, ur: NEGATIVE mg/dL
Nitrite: POSITIVE — AB
Protein, ur: 30 mg/dL — AB
Specific Gravity, Urine: 1.015 (ref 1.005–1.030)
pH: 6 (ref 5.0–8.0)

## 2022-06-13 LAB — BASIC METABOLIC PANEL
Anion gap: 8 (ref 5–15)
BUN: 14 mg/dL (ref 6–20)
CO2: 24 mmol/L (ref 22–32)
Calcium: 9 mg/dL (ref 8.9–10.3)
Chloride: 104 mmol/L (ref 98–111)
Creatinine, Ser: 0.8 mg/dL (ref 0.44–1.00)
GFR, Estimated: >60 mL/min (ref >60)
Glucose, Bld: 96 mg/dL (ref 70–99)
Potassium: 3.7 mmol/L (ref 3.5–5.1)
Sodium: 136 mmol/L (ref 135–145)

## 2022-06-13 LAB — CBC
HCT: 39 % (ref 36.0–46.0)
Hemoglobin: 12.8 g/dL (ref 12.0–15.0)
MCH: 31.4 pg (ref 26.0–34.0)
MCHC: 32.8 g/dL (ref 30.0–36.0)
MCV: 95.6 fL (ref 80.0–100.0)
Platelets: 256 10*3/uL (ref 150–400)
RBC: 4.08 MIL/uL (ref 3.87–5.11)
RDW: 13.9 % (ref 11.5–15.5)
WBC: 8 10*3/uL (ref 4.0–10.5)
nRBC: 0 % (ref 0.0–0.2)

## 2022-06-13 LAB — HCG, QUANTITATIVE, PREGNANCY: hCG, Beta Chain, Quant, S: <1 m[IU]/mL (ref <5)

## 2022-06-13 MED ORDER — ONDANSETRON 8 MG PO TBDP
8.0000 mg | ORAL_TABLET | Freq: Once | ORAL | Status: AC
  Administered 2022-06-14: 8 mg via ORAL
  Filled 2022-06-13: qty 1

## 2022-06-13 NOTE — ED Triage Notes (Signed)
Patient reports left sided flank pain that began yesterday. Rates pain 7/10. No changes in urination or bowel movements. Patient also feels nauseous.

## 2022-06-13 NOTE — ED Notes (Signed)
Labeled specimen cup given to pt for U/A collection per MD order. ENMiles 

## 2022-06-13 NOTE — ED Notes (Signed)
Called patient for triage, patient stated "I am just going to go to another hospital" Patient then left.

## 2022-06-14 ENCOUNTER — Encounter (HOSPITAL_COMMUNITY): Payer: Self-pay | Admitting: Emergency Medicine

## 2022-06-14 ENCOUNTER — Emergency Department (HOSPITAL_COMMUNITY): Payer: Medicaid Other

## 2022-06-14 ENCOUNTER — Other Ambulatory Visit: Payer: Self-pay

## 2022-06-14 ENCOUNTER — Telehealth: Payer: Self-pay

## 2022-06-14 LAB — CYTOLOGY - PAP
Comment: NEGATIVE
Diagnosis: NEGATIVE
High risk HPV: NEGATIVE

## 2022-06-14 LAB — TSH+PRL+TESTT+TESTF+17OHP
17-Hydroxyprogesterone: 77 ng/dL
Prolactin: 8.2 ng/mL (ref 4.8–33.4)
TSH: 2.84 u[IU]/mL (ref 0.450–4.500)
Testosterone, Free: 3.5 pg/mL (ref 0.0–4.2)
Testosterone, Total, LC/MS: 59.6 ng/dL — ABNORMAL HIGH (ref 10.0–55.0)

## 2022-06-14 LAB — RPR+HBSAG+HCVAB+...
HIV Screen 4th Generation wRfx: NONREACTIVE
Hep C Virus Ab: NONREACTIVE
Hepatitis B Surface Ag: NEGATIVE
RPR Ser Ql: NONREACTIVE

## 2022-06-14 LAB — HEPATIC FUNCTION PANEL
ALT: 17 U/L (ref 0–44)
AST: 18 U/L (ref 15–41)
Albumin: 3.8 g/dL (ref 3.5–5.0)
Alkaline Phosphatase: 59 U/L (ref 38–126)
Bilirubin, Direct: <0.1 mg/dL (ref 0.0–0.2)
Total Bilirubin: 0.7 mg/dL (ref 0.3–1.2)
Total Protein: 7.7 g/dL (ref 6.5–8.1)

## 2022-06-14 LAB — LIPASE, BLOOD: Lipase: 30 U/L (ref 11–51)

## 2022-06-14 MED ORDER — CEPHALEXIN 500 MG PO CAPS: 500.0000 mg | ORAL_CAPSULE | Freq: Four times a day (QID) | ORAL | 0 refills | Status: AC

## 2022-06-14 MED ORDER — SODIUM CHLORIDE 0.9 % IV BOLUS
1000.0000 mL | Freq: Once | INTRAVENOUS | Status: AC
  Administered 2022-06-14: 1000 mL via INTRAVENOUS

## 2022-06-14 MED ORDER — IOHEXOL 300 MG/ML  SOLN
100.0000 mL | Freq: Once | INTRAMUSCULAR | Status: AC | PRN
  Administered 2022-06-14: 100 mL via INTRAVENOUS

## 2022-06-14 MED ORDER — ONDANSETRON 4 MG PO TBDP: 4.0000 mg | ORAL_TABLET | Freq: Three times a day (TID) | ORAL | 0 refills | Status: AC | PRN

## 2022-06-14 MED ORDER — SODIUM CHLORIDE (PF) 0.9 % IJ SOLN
INTRAMUSCULAR | Status: AC
  Filled 2022-06-14: qty 50

## 2022-06-14 MED ORDER — SODIUM CHLORIDE 0.9 % IV SOLN
1.0000 g | Freq: Once | INTRAVENOUS | Status: AC
  Administered 2022-06-14: 1 g via INTRAVENOUS
  Filled 2022-06-14: qty 10

## 2022-06-14 MED ORDER — KETOROLAC TROMETHAMINE 15 MG/ML IJ SOLN
15.0000 mg | Freq: Once | INTRAMUSCULAR | Status: AC
  Administered 2022-06-14: 15 mg via INTRAVENOUS
  Filled 2022-06-14: qty 1

## 2022-06-14 MED ORDER — MORPHINE SULFATE (PF) 4 MG/ML IV SOLN
4.0000 mg | Freq: Once | INTRAVENOUS | Status: AC
  Administered 2022-06-14: 4 mg via INTRAVENOUS
  Filled 2022-06-14: qty 1

## 2022-06-14 NOTE — Telephone Encounter (Signed)
Reached out to schedule patient for surgery w/ Dr. Macon Large. Patient confirmed she is available on 08/03/22 and would like to proceed w/ scheduling. Provided patient w/ date, time, location and pre-op instructions. Patient understands that she must arrive at 12:30 pm.

## 2022-06-14 NOTE — ED Provider Notes (Signed)
EMERGENCY DEPARTMENT AT [facility] Provider Note  CSN: [id number] Arrival date & time: 06/13/22 2139  Chief Complaint(s) Flank Pain  HPI Laurie Morrow is a 27 y.o. female who presents to the emergency department with 2 days of right flank/right lower quadrant abdominal pain.  Pain constant since onset and gradually worsening.  Reports nausea without emesis.  Denies any diarrhea, dysuria, frequency, urgency.  She does report being treated for bacterial vaginosis and is currently taking metronidazole.  Patient was seen by her OB/GYN and record reveals that she had negative GC/chlamydia earlier this week.  Patient denies any other physical complaints.  The history is provided by the patient.    Past Medical History Past Medical History:  Diagnosis Date   Abnormal uterine bleeding (AUB) 05/14/2019   Anxiety    B12 deficiency 05/17/2019   Depression with anxiety 05/14/2019   Iron deficiency 06/04/2019   Strep throat    Patient Active Problem List   Diagnosis Date Noted   Endometrial polyp 06/08/2022   Abnormal uterine bleeding (AUB) 05/14/2019   Home Medication(s) Prior to Admission medications   Medication Sig Start Date End Date Taking? Authorizing Provider  cephALEXin (KEFLEX) 500 MG capsule Take 1 capsule (500 mg total) by mouth 4 (four) times daily for 14 days. 06/14/22 06/28/22 Yes Ronith Berti, Amadeo Garnet, MD  ondansetron (ZOFRAN-ODT) 4 MG disintegrating tablet Take 1 tablet (4 mg total) by mouth every 8 (eight) hours as needed for up to 3 days for nausea or vomiting. 06/14/22 06/17/22 Yes Sanyah Molnar, Amadeo Garnet, MD  Iron, Ferrous Sulfate, 325 (65 Fe) MG TABS Take one daily Patient not taking: Reported on 06/08/2022 07/25/19   Mliss Sax, MD  metroNIDAZOLE (FLAGYL) 500 MG tablet Take 1 tablet (500 mg total) by mouth 2 (two) times daily for 7 days. 06/09/22 06/16/22  Tereso Newcomer, MD                                                                                                                                     Allergies Patient has no known allergies.  Review of Systems Review of Systems As noted in HPI  Physical Exam Vital Signs  I have reviewed the triage vital signs BP 132/78   Pulse 62   Temp 98.3 F (36.8 C)   Resp 16   LMP  (LMP Unknown) Comment: negative HCG quantitiative 06/13/22  SpO2 98%   Physical Exam Vitals reviewed.  Constitutional:      General: She is not in acute distress.    Appearance: She is well-developed. She is not diaphoretic.  HENT:     Head: Normocephalic and atraumatic.     Right Ear: External ear normal.     Left Ear: External ear normal.     Nose: Nose normal.  Eyes:     General: No scleral icterus.    Conjunctiva/sclera: Conjunctivae normal.  Neck:  Trachea: Phonation normal.  Cardiovascular:     Rate and Rhythm: Normal rate and regular rhythm.  Pulmonary:     Effort: Pulmonary effort is normal. No respiratory distress.     Breath sounds: No stridor.  Abdominal:     General: There is no distension.     Tenderness: There is abdominal tenderness. Positive signs include McBurney's sign.  Musculoskeletal:        General: Normal range of motion.     Cervical back: Normal range of motion.  Neurological:     Mental Status: She is alert and oriented to person, place, and time.  Psychiatric:        Behavior: Behavior normal.     ED Results and Treatments Labs (all labs ordered are listed, but only abnormal results are displayed) Labs Reviewed  URINALYSIS, ROUTINE W REFLEX MICROSCOPIC - Abnormal; Notable for the following components:      Result Value   APPearance HAZY (*)    Hgb urine dipstick MODERATE (*)    Protein, ur 30 (*)    Nitrite POSITIVE (*)    Leukocytes,Ua TRACE (*)    Bacteria, UA MANY (*)    All other components within normal limits  BASIC METABOLIC PANEL  CBC  HCG, QUANTITATIVE, PREGNANCY  LIPASE, BLOOD  HEPATIC FUNCTION PANEL                                                                                                                          EKG  EKG Interpretation  Date/Time:    Ventricular Rate:    PR Interval:    QRS Duration:   QT Interval:    QTC Calculation:   R Axis:     Text Interpretation:         Radiology CT ABDOMEN PELVIS W CONTRAST  Result Date: 06/14/2022 CLINICAL DATA:  Left flank pain EXAM: CT ABDOMEN AND PELVIS WITH CONTRAST TECHNIQUE: Multidetector CT imaging of the abdomen and pelvis was performed using the standard protocol following bolus administration of intravenous contrast. RADIATION DOSE REDUCTION: This exam was performed according to the departmental dose-optimization program which includes automated exposure control, adjustment of the mA and/or kV according to patient size and/or use of iterative reconstruction technique. CONTRAST:  OMNIPAQUE IOHEXOL 300 MG/ML  SOLN COMPARISON:  04/27/2012 FINDINGS: Lower chest: No acute abnormality. Hepatobiliary: No focal liver abnormality is seen. No gallstones, gallbladder wall thickening, or biliary dilatation. Pancreas: Unremarkable Spleen: Unremarkable Adrenals/Urinary Tract: Adrenal glands are unremarkable. Kidneys are normal, without renal calculi, focal lesion, or hydronephrosis. Bladder is unremarkable. Stomach/Bowel: Stomach is within normal limits. Appendix appears normal. No evidence of bowel wall thickening, distention, or inflammatory changes. Vascular/Lymphatic: No significant vascular findings are present. No enlarged abdominal or pelvic lymph nodes. Reproductive: Uterus and bilateral adnexa are unremarkable. Trace free fluid within the pelvis may be physiologic. Other: Small broad-based fat containing umbilical hernia. Musculoskeletal: No acute or significant osseous findings. IMPRESSION: 1. No acute intra-abdominal pathology identified. No definite  radiographic explanation for the patient's reported symptoms. 2. Trace free fluid within the pelvis, likely  physiologic. Electronically Signed   By: Helyn Numbers M.D.   On: 06/14/2022 03:01    Medications Ordered in ED Medications  ketorolac (TORADOL) 15 MG/ML injection 15 mg (has no administration in time range)  ondansetron (ZOFRAN-ODT) disintegrating tablet 8 mg (8 mg Oral Given 06/14/22 0026)  morphine (PF) 4 MG/ML injection 4 mg (4 mg Intravenous Given 06/14/22 0137)  sodium chloride 0.9 % bolus 1,000 mL (0 mLs Intravenous Stopped 06/14/22 0214)  cefTRIAXone (ROCEPHIN) 1 g in sodium chloride 0.9 % 100 mL IVPB (1 g Intravenous New Bag/Given 06/14/22 0229)  iohexol (OMNIPAQUE) 300 MG/ML solution 100 mL (100 mLs Intravenous Contrast Given 06/14/22 0210)   Procedures Procedures  (including critical care time) Medical Decision Making / ED Course   Medical Decision Making Amount and/or Complexity of Data Reviewed Labs: ordered. Decision-making details documented in ED Course. Radiology: ordered and independent interpretation performed. Decision-making details documented in ED Course.  Risk Prescription drug management. Parenteral controlled substances. Decision regarding hospitalization.    Right flank/right lower quadrant abdominal pain  Differential includes but not limited to pancreatitis, urinary tract infection, pregnancy related process, biliary disease.  CBC with no leukocytosis or anemia. Metabolic panel without significant electrolyte derangements or renal sufficiency. No evidence of bili obstruction or pancreatitis. hCG negative UA is consistent with a urinary tract infection. CT negative for any acute intra-abdominal inflammatory/infectious process.  Provided with pain medicine, IVF, and 1st dose of Abx.  Patient is tolerating p.o. intake, workup does not support sepsis, and she does not require admission to the hospital.    Final Clinical Impression(s) / ED Diagnoses Final diagnoses:  Acute pyelonephritis   The patient appears reasonably screened and/or stabilized  for discharge and I doubt any other medical condition or other [facility] requiring further screening, evaluation, or treatment in the ED at this time. I have discussed the findings, Dx and Tx plan with the patient/family who expressed understanding and agree(s) with the plan. Discharge instructions discussed at length. The patient/family was given strict return precautions who verbalized understanding of the instructions. No further questions at time of discharge.  Disposition: Discharge  Condition: Good  ED Discharge Orders          Ordered    ondansetron (ZOFRAN-ODT) 4 MG disintegrating tablet  Every 8 hours PRN        06/14/22 0311    cephALEXin (KEFLEX) 500 MG capsule  4 times daily        06/14/22 4098             Follow Up: Primary care provider  Call  to schedule an appointment for close follow up    This chart was dictated using voice recognition software.  Despite best efforts to proofread,  errors can occur which can change the documentation meaning.    Nira Conn, MD 06/14/22 [phone]

## 2022-06-14 NOTE — ED Notes (Signed)
Pt inquiring about pain medication

## 2022-07-13 ENCOUNTER — Encounter (HOSPITAL_BASED_OUTPATIENT_CLINIC_OR_DEPARTMENT_OTHER): Payer: Self-pay | Admitting: Obstetrics & Gynecology

## 2022-07-13 NOTE — Progress Notes (Signed)
Spoke w/ via phone for pre-op interview--- [employer]----  CBC and UPT per Careers adviser.             Lab results------ COVID test -----patient states asymptomatic no test needed Arrive at -------1245 NPO after MN NO Solid Food.  Clear liquids from MN until---1145 Med rec completed Medications to take morning of surgery -----NONE Diabetic medication ----- Patient instructed no nail polish to be worn day of surgery Patient instructed to bring photo id and insurance card day of surgery Patient aware to have Driver (ride ) / caregiver  Mother Leanne Chang Core  for 24 hours after surgery  Patient Special Instructions ----- Pre-Op special Instructions ----- Patient verbalized understanding of instructions that were given at this phone interview. Patient denies shortness of breath, chest pain, fever, cough at this phone interview.

## 2022-08-02 NOTE — H&P (Signed)
Preoperative History and Physical  Laurie Morrow is a 27 y.o. G0P0000 here for surgical management of abnormal uterine bleeding and recently diagnosed endometrial polyp.   No significant preoperative concerns.  Proposed surgery: Hysteroscopy, Dilation and Curettage and Polypectomy.  Past Medical History:  Diagnosis Date   Abnormal uterine bleeding (AUB) 05/14/2019   Anxiety    B12 deficiency 05/17/2019   Depression with anxiety 05/14/2019   Iron deficiency 06/04/2019   Strep throat    Past Surgical History:  Procedure Laterality Date   KNEE SURGERY     left   OB History  Gravida Para Term Preterm AB Living  0 0 0 0 0 0  SAB IAB Ectopic Multiple Live Births  0 0 0 0 0  Patient denies any other pertinent gynecologic issues.   No current facility-administered medications on file prior to encounter.   Current Outpatient Medications on File Prior to Encounter  Medication Sig Dispense Refill   Iron, Ferrous Sulfate, 325 (65 Fe) MG TABS Take one daily (Patient not taking: Reported on 06/08/2022) 90 tablet 1   No Known Allergies  Social History:   reports that she has quit smoking. She has never used smokeless tobacco. She reports current alcohol use of about 3.0 standard drinks of alcohol per week. She reports that she does not currently use drugs after having used the following drugs: Marijuana.  Family History  Problem Relation Age of Onset   Diabetes Mother    Hypertension Mother    Diabetes Maternal Grandmother    Hypertension Maternal Grandmother    Hypertension Maternal Grandfather    Diabetes Maternal Grandfather     Review of Systems: Pertinent items noted in HPI and remainder of comprehensive ROS otherwise negative.  PHYSICAL EXAM: Last menstrual period 06/15/2022. CONSTITUTIONAL: Well-developed, well-nourished female in no acute distress.  HENT:  Normocephalic, atraumatic, External right and left ear normal. Oropharynx is clear and moist EYES:  Conjunctivae and EOM are normal. Pupils are equal, round, and reactive to light. No scleral icterus.  NECK: Normal range of motion, supple, no masses SKIN: Skin is warm and dry. No rash noted. Not diaphoretic. No erythema. No pallor. NEUROLOGIC: Alert and oriented to person, place, and time. Normal reflexes, muscle tone coordination. No cranial nerve deficit noted. PSYCHIATRIC: Normal mood and affect. Normal behavior. Normal judgment and thought content. CARDIOVASCULAR: Normal heart rate noted, regular rhythm RESPIRATORY: Effort and breath sounds normal, no problems with respiration noted ABDOMEN: Soft, nontender, nondistended. PELVIC: Deferred MUSCULOSKELETAL: Normal range of motion. No edema and no tenderness. 2+ distal pulses.  Labs: No results found for this or any previous visit (from the past 336 hour(s)).  Imaging Studies: CT ABDOMEN PELVIS W CONTRAST  Result Date: 06/14/2022 CLINICAL DATA:  Left flank pain EXAM: CT ABDOMEN AND PELVIS WITH CONTRAST TECHNIQUE: Multidetector CT imaging of the abdomen and pelvis was performed using the standard protocol following bolus administration of intravenous contrast. RADIATION DOSE REDUCTION: This exam was performed according to the departmental dose-optimization program which includes automated exposure control, adjustment of the mA and/or kV according to patient size and/or use of iterative reconstruction technique. CONTRAST:  OMNIPAQUE IOHEXOL 300 MG/ML  SOLN COMPARISON:  04/27/2012 FINDINGS: Lower chest: No acute abnormality. Hepatobiliary: No focal liver abnormality is seen. No gallstones, gallbladder wall thickening, or biliary dilatation. Pancreas: Unremarkable Spleen: Unremarkable Adrenals/Urinary Tract: Adrenal glands are unremarkable. Kidneys are normal, without renal calculi, focal lesion, or hydronephrosis. Bladder is unremarkable. Stomach/Bowel: Stomach is within normal limits. Appendix  appears normal. No evidence of bowel wall  thickening, distention, or inflammatory changes. Vascular/Lymphatic: No significant vascular findings are present. No enlarged abdominal or pelvic lymph nodes. Reproductive: Uterus and bilateral adnexa are unremarkable. Trace free fluid within the pelvis may be physiologic. Other: Small broad-based fat containing umbilical hernia. Musculoskeletal: No acute or significant osseous findings. IMPRESSION: 1. No acute intra-abdominal pathology identified. No definite radiographic explanation for the patient's reported symptoms. 2. Trace free fluid within the pelvis, likely physiologic. Electronically Signed   By: Helyn Numbers M.D.   On: 06/14/2022 03:01   US PELVIC COMPLETE W TRANSVAGINAL AND TORSION R/O  Result Date: 05/23/2022 CLINICAL DATA:  Dysfunctional uterine bleeding, unknown LMP EXAM: TRANSABDOMINAL AND TRANSVAGINAL ULTRASOUND OF PELVIS DOPPLER ULTRASOUND OF OVARIES TECHNIQUE: Both transabdominal and transvaginal ultrasound examinations of the pelvis were performed. Transabdominal technique was performed for global imaging of the pelvis including uterus, ovaries, adnexal regions, and pelvic cul-de-sac. It was necessary to proceed with endovaginal exam following the transabdominal exam to visualize the ovaries bilaterally and endometrium. Color and duplex Doppler ultrasound was utilized to evaluate blood flow to the ovaries. COMPARISON:  CT and pelvic sonogram 04/27/2012 FINDINGS: Uterus Measurements: 7.0 x 3.8 x 4.3 cm = volume: 58 mL. The uterus is anteverted. The cervix is closed. A trace amount of simple appearing free fluid is seen within the endometrial cavity within the lower uterine segment extending into the internal cervical os. No intrauterine masses are seen. Endometrium Thickness: 9 mm. There is a lobulated heterogeneously hyper and hypoechoic mass seen within the endometrial cavity within the fundus measuring 11 x 6 mm best seen on image # [id number] suspicious for an endometrial polyp. Less likely  this may represent an intracavitary uterine fibroid. Right ovary Measurements: 3.3 x 2.8 x 3.0 cm = volume: 15 mL. Normal appearance/no adnexal mass. Left ovary Measurements: 2.4 x 1.7 x 1.8 cm = volume: 4 mL. Normal appearance/no adnexal mass. Pulsed Doppler evaluation of both ovaries demonstrates normal low-resistance arterial and venous waveforms. Other findings No abnormal free fluid. IMPRESSION: 1. 11 x 6 mm lobulated heterogeneously hyper and hypoechoic mass within the endometrial cavity within the fundus suspicious for an endometrial polyp. Correlation with sonohysterography or hysteroscopy is recommended for further evaluation. Electronically Signed   By: Helyn Numbers M.D.   On: 05/23/2022 19:29     Assessment: Patient Active Problem List   Diagnosis Date Noted   Endometrial polyp 06/08/2022   Abnormal uterine bleeding (AUB) 05/14/2019    Plan: Patient will undergo surgical management with Hysteroscopy, Dilation and Curettage and Polypectomy.   The risks of surgery were discussed in detail with the patient including but not limited to:  bleeding which may require transfusion; infection which may require antibiotics; injury to uterus or surrounding organs; intrauterine scarring which may impair future fertility; need for additional procedures including laparotomy or laparoscopy secondary to intraoperative injury or abnormal pathology; and other postoperative/anesthesia complication.  Patient was told that the likelihood that her condition and symptoms will be treated effectively with this surgical management was very high; the postoperative expectations were also discussed in detail. The patient also understands the alternative treatment options which were discussed in full; she declined medical/hormonal management as an interim treatment or alternative treatment. All questions were answered  Routine postoperative instructions will be reviewed with the patient and her family in detail after  surgery.  The patient concurred with the proposed plan, giving informed written consent for the surgery.  Patient has been NPO since last  night and she will remain NPO for procedure.  Anesthesia and OR aware.  Preoperative prophylactic antibiotics and SCDs ordered on call to the OR.  To OR when ready.    Jaynie Collins, MD, FACOG Obstetrician & Gynecologist, [facility] for [employer], [facility]

## 2022-08-03 ENCOUNTER — Other Ambulatory Visit: Payer: Self-pay

## 2022-08-03 ENCOUNTER — Ambulatory Visit (HOSPITAL_BASED_OUTPATIENT_CLINIC_OR_DEPARTMENT_OTHER)
Admission: RE | Admit: 2022-08-03 | Discharge: 2022-08-03 | Disposition: A | Discharge status: Home / Self Care | Payer: Medicaid Other | Source: Ambulatory Visit | Attending: Obstetrics & Gynecology | Admitting: Obstetrics & Gynecology

## 2022-08-03 ENCOUNTER — Encounter (HOSPITAL_BASED_OUTPATIENT_CLINIC_OR_DEPARTMENT_OTHER)
Admission: RE | Disposition: A | Discharge status: Home / Self Care | Payer: Self-pay | Source: Ambulatory Visit | Attending: Obstetrics & Gynecology

## 2022-08-03 ENCOUNTER — Encounter (HOSPITAL_BASED_OUTPATIENT_CLINIC_OR_DEPARTMENT_OTHER): Payer: Self-pay | Admitting: Obstetrics & Gynecology

## 2022-08-03 ENCOUNTER — Ambulatory Visit (HOSPITAL_BASED_OUTPATIENT_CLINIC_OR_DEPARTMENT_OTHER): Payer: Medicaid Other | Admitting: Anesthesiology

## 2022-08-03 DIAGNOSIS — N840 Polyp of corpus uteri: Secondary | ICD-10-CM | POA: Diagnosis not present

## 2022-08-03 DIAGNOSIS — N939 Abnormal uterine and vaginal bleeding, unspecified: Secondary | ICD-10-CM | POA: Diagnosis present

## 2022-08-03 DIAGNOSIS — Z87891 Personal history of nicotine dependence: Secondary | ICD-10-CM | POA: Diagnosis not present

## 2022-08-03 HISTORY — DX: Nausea with vomiting, unspecified: R11.2

## 2022-08-03 HISTORY — DX: Other specified postprocedural states: Z98.890

## 2022-08-03 HISTORY — DX: Other complications of anesthesia, initial encounter: T88.59XA

## 2022-08-03 HISTORY — PX: HYSTEROSCOPY WITH D & C: SHX1775

## 2022-08-03 LAB — CBC
HCT: 38.3 % (ref 36.0–46.0)
Hemoglobin: 12.2 g/dL (ref 12.0–15.0)
MCH: 31 pg (ref 26.0–34.0)
MCHC: 31.9 g/dL (ref 30.0–36.0)
MCV: 97.2 fL (ref 80.0–100.0)
Platelets: 325 10*3/uL (ref 150–400)
RBC: 3.94 MIL/uL (ref 3.87–5.11)
RDW: 13.4 % (ref 11.5–15.5)
WBC: 6.2 10*3/uL (ref 4.0–10.5)
nRBC: 0 % (ref 0.0–0.2)

## 2022-08-03 LAB — POCT PREGNANCY, URINE: Preg Test, Ur: NEGATIVE

## 2022-08-03 SURGERY — DILATATION AND CURETTAGE /HYSTEROSCOPY
Anesthesia: General

## 2022-08-03 MED ORDER — CELECOXIB 200 MG PO CAPS
ORAL_CAPSULE | ORAL | Status: AC
  Filled 2022-08-03: qty 2

## 2022-08-03 MED ORDER — ACETAMINOPHEN 500 MG PO TABS
1000.0000 mg | ORAL_TABLET | ORAL | Status: AC
  Administered 2022-08-03: 1000 mg via ORAL

## 2022-08-03 MED ORDER — ONDANSETRON HCL 4 MG/2ML IJ SOLN
INTRAMUSCULAR | Status: AC
  Filled 2022-08-03: qty 2

## 2022-08-03 MED ORDER — LACTATED RINGERS IV SOLN: INTRAVENOUS | Status: DC

## 2022-08-03 MED ORDER — FENTANYL CITRATE (PF) 100 MCG/2ML IJ SOLN
INTRAMUSCULAR | Status: DC | PRN
  Administered 2022-08-03: 50 ug via INTRAVENOUS

## 2022-08-03 MED ORDER — BUPIVACAINE HCL 0.5 % IJ SOLN
INTRAMUSCULAR | Status: DC | PRN
  Administered 2022-08-03: 30 mL

## 2022-08-03 MED ORDER — CELECOXIB 200 MG PO CAPS
400.0000 mg | ORAL_CAPSULE | ORAL | Status: AC
  Administered 2022-08-03: 400 mg via ORAL

## 2022-08-03 MED ORDER — KETOROLAC TROMETHAMINE 30 MG/ML IJ SOLN
INTRAMUSCULAR | Status: AC
  Filled 2022-08-03: qty 1

## 2022-08-03 MED ORDER — FENTANYL CITRATE (PF) 100 MCG/2ML IJ SOLN
INTRAMUSCULAR | Status: AC
  Filled 2022-08-03: qty 2

## 2022-08-03 MED ORDER — DEXMEDETOMIDINE HCL IN NACL 80 MCG/20ML IV SOLN
INTRAVENOUS | Status: DC | PRN
  Administered 2022-08-03: 8 ug via INTRAVENOUS

## 2022-08-03 MED ORDER — PROMETHAZINE HCL 25 MG/ML IJ SOLN: 6.2500 mg | INTRAMUSCULAR | Status: DC | PRN

## 2022-08-03 MED ORDER — MIDAZOLAM HCL 5 MG/5ML IJ SOLN
INTRAMUSCULAR | Status: DC | PRN
  Administered 2022-08-03: 2 mg via INTRAVENOUS

## 2022-08-03 MED ORDER — ACETAMINOPHEN 500 MG PO TABS
ORAL_TABLET | ORAL | Status: AC
  Filled 2022-08-03: qty 2

## 2022-08-03 MED ORDER — FENTANYL CITRATE (PF) 100 MCG/2ML IJ SOLN: 25.0000 ug | INTRAMUSCULAR | Status: DC | PRN

## 2022-08-03 MED ORDER — OXYCODONE HCL 5 MG PO TABS: 5.0000 mg | ORAL_TABLET | Freq: Once | ORAL | Status: DC | PRN

## 2022-08-03 MED ORDER — PROPOFOL 10 MG/ML IV BOLUS
INTRAVENOUS | Status: DC | PRN
  Administered 2022-08-03: 30 mg via INTRAVENOUS
  Administered 2022-08-03: 200 mg via INTRAVENOUS

## 2022-08-03 MED ORDER — MIDAZOLAM HCL 2 MG/2ML IJ SOLN
INTRAMUSCULAR | Status: AC
  Filled 2022-08-03: qty 2

## 2022-08-03 MED ORDER — OXYCODONE HCL 5 MG/5ML PO SOLN: 5.0000 mg | Freq: Once | ORAL | Status: DC | PRN

## 2022-08-03 MED ORDER — GABAPENTIN 300 MG PO CAPS
ORAL_CAPSULE | ORAL | Status: AC
  Filled 2022-08-03: qty 1

## 2022-08-03 MED ORDER — IBUPROFEN 800 MG PO TABS: 800.0000 mg | ORAL_TABLET | Freq: Three times a day (TID) | ORAL | 3 refills | Status: DC | PRN

## 2022-08-03 MED ORDER — SODIUM CHLORIDE 0.9 % IR SOLN
Status: DC | PRN
  Administered 2022-08-03: 3000 mL

## 2022-08-03 MED ORDER — DEXAMETHASONE SODIUM PHOSPHATE 10 MG/ML IJ SOLN
INTRAMUSCULAR | Status: AC
  Filled 2022-08-03: qty 1

## 2022-08-03 MED ORDER — GABAPENTIN 300 MG PO CAPS
300.0000 mg | ORAL_CAPSULE | ORAL | Status: AC
  Administered 2022-08-03: 300 mg via ORAL

## 2022-08-03 MED ORDER — DEXAMETHASONE SODIUM PHOSPHATE 10 MG/ML IJ SOLN
INTRAMUSCULAR | Status: DC | PRN
  Administered 2022-08-03: 10 mg via INTRAVENOUS

## 2022-08-03 MED ORDER — ONDANSETRON HCL 4 MG/2ML IJ SOLN
INTRAMUSCULAR | Status: DC | PRN
  Administered 2022-08-03: 4 mg via INTRAVENOUS

## 2022-08-03 MED ORDER — ACETAMINOPHEN 500 MG PO TABS: 1000.0000 mg | ORAL_TABLET | Freq: Four times a day (QID) | ORAL | Status: DC | PRN

## 2022-08-03 MED ORDER — LIDOCAINE 2% (20 MG/ML) 5 ML SYRINGE
INTRAMUSCULAR | Status: DC | PRN
  Administered 2022-08-03: 80 mg via INTRAVENOUS

## 2022-08-03 MED ORDER — OXYCODONE HCL 5 MG PO TABS: 5.0000 mg | ORAL_TABLET | ORAL | 0 refills | Status: DC | PRN

## 2022-08-03 MED ORDER — DOCUSATE SODIUM 100 MG PO CAPS: 100.0000 mg | ORAL_CAPSULE | Freq: Two times a day (BID) | ORAL | 2 refills | Status: DC | PRN

## 2022-08-03 MED ORDER — LIDOCAINE HCL (PF) 2 % IJ SOLN
INTRAMUSCULAR | Status: AC
  Filled 2022-08-03: qty 10

## 2022-08-03 SURGICAL SUPPLY — 21 items
CURETTE PIPELLE ENDOMTRL SUCTN (MISCELLANEOUS) IMPLANT
DEVICE MYOSURE REACH (MISCELLANEOUS) IMPLANT
GAUZE 4X4 16PLY ~~LOC~~+RFID DBL (SPONGE) ×2 IMPLANT
GLOVE BIOGEL PI IND STRL 6.5 (GLOVE) IMPLANT
GLOVE BIOGEL PI IND STRL 7.0 (GLOVE) ×4 IMPLANT
GLOVE ECLIPSE 7.0 STRL STRAW (GLOVE) ×2 IMPLANT
GLOVE SURG SS PI 6.5 STRL IVOR (GLOVE) IMPLANT
GOWN STRL REUS W/ TWL LRG LVL3 (GOWN DISPOSABLE) IMPLANT
GOWN STRL REUS W/TWL LRG LVL3 (GOWN DISPOSABLE) ×5 IMPLANT
KIT PROCEDURE FLUENT (KITS) ×2 IMPLANT
KIT TURNOVER CYSTO (KITS) ×2 IMPLANT
PACK VAGINAL MINOR WOMEN LF (CUSTOM PROCEDURE TRAY) ×2 IMPLANT
PAD OB MATERNITY 4.3X12.25 (PERSONAL CARE ITEMS) ×2 IMPLANT
PIPELLE ENDOMETRIAL SUCTION CU (MISCELLANEOUS) ×1
SEAL ROD LENS SCOPE MYOSURE (ABLATOR) ×2 IMPLANT
SLEEVE SCD COMPRESS KNEE MED (STOCKING) ×2 IMPLANT
SOL PREP POV-IOD 4OZ 10% (MISCELLANEOUS) IMPLANT
SPIKE FLUID TRANSFER (MISCELLANEOUS) IMPLANT
SYR 30ML LL (SYRINGE) IMPLANT
TOWEL OR 17X24 6PK STRL BLUE (TOWEL DISPOSABLE) ×2 IMPLANT
UNDERPAD 30X36 HEAVY ABSORB (UNDERPADS AND DIAPERS) ×2 IMPLANT

## 2022-08-03 NOTE — Anesthesia Postprocedure Evaluation (Signed)
Anesthesia Post Note  Patient: Laurie Morrow  Procedure(s) Performed: DILATATION AND CURETTAGE /HYSTEROSCOPY/ POLYPECTOMY     Patient location during evaluation: PACU Anesthesia Type: General Level of consciousness: awake Pain management: pain level controlled Vital Signs Assessment: post-procedure vital signs reviewed and stable Respiratory status: spontaneous breathing, nonlabored ventilation and respiratory function stable Cardiovascular status: blood pressure returned to baseline and stable Postop Assessment: no apparent nausea or vomiting Anesthetic complications: no   No notable events documented.  Last Vitals:  Vitals:   08/03/22 1415 08/03/22 1440  BP: 129/89 107/72  Pulse: 82 (!) 54  Resp: 16 14  Temp:  36.8 C  SpO2: 92% 96%    Last Pain:  Vitals:   08/03/22 1440  TempSrc:   PainSc: 3                  Linton Rump

## 2022-08-03 NOTE — Anesthesia Procedure Notes (Signed)
Procedure Name: LMA Insertion Date/Time: 08/03/2022 1:06 PM  Performed by: Bishop Limbo, CRNAPre-anesthesia Checklist: Patient identified, Emergency Drugs available, Suction available and Patient being monitored Patient Re-evaluated:Patient Re-evaluated prior to induction Oxygen Delivery Method: Circle System Utilized Preoxygenation: Pre-oxygenation with 100% oxygen Induction Type: IV induction Ventilation: Mask ventilation without difficulty LMA: LMA inserted LMA Size: 4.0 Number of attempts: 1 Placement Confirmation: positive ETCO2 Tube secured with: Tape Dental Injury: Teeth and Oropharynx as per pre-operative assessment

## 2022-08-03 NOTE — Discharge Instructions (Signed)
     No acetaminophen/Tylenol until after 5:15 pm today if needed.  No ibuprofen, Advil, Aleve, Motrin, ketorolac, meloxicam, naproxen, or other NSAIDS until after 5:15 pm today if needed.   Post Anesthesia Home Care Instructions  Activity: Get plenty of rest for the remainder of the day. A responsible individual must stay with you for 24 hours following the procedure.  For the next 24 hours, DO NOT: -Drive a car -Advertising copywriter -Drink alcoholic beverages -Take any medication unless instructed by your physician -Make any legal decisions or sign important papers.  Meals: Start with liquid foods such as gelatin or soup. Progress to regular foods as tolerated. Avoid greasy, spicy, heavy foods. If nausea and/or vomiting occur, drink only clear liquids until the nausea and/or vomiting subsides. Call your physician if vomiting continues.  Special Instructions/Symptoms: Your throat may feel dry or sore from the anesthesia or the breathing tube placed in your throat during surgery. If this causes discomfort, gargle with warm salt water. The discomfort should disappear within 24 hours.

## 2022-08-03 NOTE — Transfer of Care (Signed)
Immediate Anesthesia Transfer of Care Note  Patient: Laurie Morrow  Procedure(s) Performed: DILATATION AND CURETTAGE /HYSTEROSCOPY/ POLYPECTOMY  Patient Location: PACU  Anesthesia Type:General  Level of Consciousness: drowsy, patient cooperative, and responds to stimulation  Airway & Oxygen Therapy: Patient Spontanous Breathing and Patient connected to nasal cannula oxygen  Post-op Assessment: Post -op Vital signs reviewed and stable  Post vital signs: Reviewed and stable  Last Vitals:  Vitals Value Taken Time  BP    Temp    Pulse 88 08/03/22 1344  Resp 13 08/03/22 1344  SpO2 94 % 08/03/22 1344  Vitals shown include unfiled device data.  Last Pain:  Vitals:   08/03/22 1128  TempSrc: Oral  PainSc: 0-No pain      Patients Stated Pain Goal: 5 (08/03/22 1128)  Complications: No notable events documented.

## 2022-08-03 NOTE — Anesthesia Preprocedure Evaluation (Addendum)
Anesthesia Evaluation  Patient identified by MRN, date of birth, ID band Patient awake    Reviewed: Allergy & Precautions, NPO status , Patient's Chart, lab work & pertinent test results  History of Anesthesia Complications (+) PONV and history of anesthetic complications  Airway Mallampati: I  TM Distance: >3 FB Neck ROM: Full    Dental  (+) Dental Advisory Given   Pulmonary neg shortness of breath, neg sleep apnea, neg COPD, neg recent URI, former smoker   Pulmonary exam normal breath sounds clear to auscultation       Cardiovascular negative cardio ROS  Rhythm:Regular Rate:Normal     Neuro/Psych  PSYCHIATRIC DISORDERS Anxiety Depression    negative neurological ROS     GI/Hepatic negative GI ROS, Neg liver ROS,,,  Endo/Other  negative endocrine ROS    Renal/GU negative Renal ROS     Musculoskeletal   Abdominal   Peds  Hematology negative hematology ROS (+)   Anesthesia Other Findings 27 y.o. G0P0000 here for surgical management of abnormal uterine bleeding and recently diagnosed endometrial polyp.   Reproductive/Obstetrics                              Anesthesia Physical Anesthesia Plan  ASA: 1  Anesthesia Plan: General   Post-op Pain Management: Tylenol PO (pre-op)*   Induction: Intravenous  PONV Risk Score and Plan: 3 and Ondansetron, Dexamethasone and Treatment may vary due to age or medical condition  Airway Management Planned: LMA  Additional Equipment:   Intra-op Plan:   Post-operative Plan: Extubation in OR  Informed Consent: I have reviewed the patients History and Physical, chart, labs and discussed the procedure including the risks, benefits and alternatives for the proposed anesthesia with the patient or authorized representative who has indicated his/her understanding and acceptance.     Dental advisory given  Plan Discussed with: CRNA and  Anesthesiologist  Anesthesia Plan Comments: (Risks of general anesthesia discussed including, but not limited to, sore throat, hoarse voice, chipped/damaged teeth, injury to vocal cords, nausea and vomiting, allergic reactions, lung infection, heart attack, stroke, and death. All questions answered. )         Anesthesia Quick Evaluation

## 2022-08-03 NOTE — Op Note (Signed)
PREOPERATIVE DIAGNOSES:  Abnormal uterine bleeding, endometrial polyp POSTOPERATIVE DIAGNOSES: Abnormal uterine bleeding, endometrial polyps PROCEDURE: Hysteroscopy, Dilation and Curettage, Polypectomy using Myosure, SURGEON:  Dr. Jaynie Collins  INDICATIONS: 27 y.o. G0P0000  here for scheduled surgery for the aforementioned diagnoses.   Risks of surgery were discussed with the patient including but not limited to: bleeding which may require transfusion; infection which may require antibiotics; injury to uterus or surrounding organs; intrauterine scarring which may impair future fertility; need for additional procedures including laparotomy or laparoscopy; and other postoperative/anesthesia complications. Written informed consent was obtained.    FINDINGS:  A 7 week size uterus. Two small pedunculated 5-6 mm polyps seen next to each ostia, one on each side.  1 cm sessile polyp seen in central posterior fundal area, and another similar one on the left posterior fundal side.   Diffuse proliferative endometrium.  Normal ostia bilaterally.  ANESTHESIA:   General, paracervical block with 30 ml of 0.5% Marcaine FLUID DEFICITS:  95 ml of NS ESTIMATED BLOOD LOSS:  10 ml SPECIMENS: Polyp fragments and endometrial curettings sent to pathology COMPLICATIONS:  None immediate.  PROCEDURE DETAILS:  The patient was then taken to the operating room where general anesthesia was administered and was found to be adequate.  After an adequate timeout was performed, she was placed in the dorsal lithotomy position and examined; then prepped and draped in the sterile manner.   Her bladder was catheterized for an unmeasured amount of clear, yellow urine. A speculum was then placed in the patient's vagina and a single tooth tenaculum was applied to the anterior lip of the cervix.   A paracervical block using 30 ml of 0.5% Marcaine was administered.  The uterus was sounded to 7 cm and the cervix was dilated manually with metal  dilators to accommodate the Myosure operative hysteroscope.  The hysteroscope was then inserted under direct visualization using NS as a suspension medium.  The uterine cavity was carefully examined with the findings as noted above.   After further careful visualization of the uterine cavity, the Myosure REACH device was inserted and used to perform the polypectomy for all the polyps, also used to do guided curettage of the endometrium.  The hysteroscope equipment was then removed from the uterus.   A sharp curettage was then performed to obtain a moderate amount of endometrial curettings.  The tenaculum was removed from the anterior lip of the cervix and the vaginal speculum was removed after noting good hemostasis.  The patient tolerated the procedure well and was taken to the recovery area awake, extubated and in stable condition.  The patient will be discharged to home as per PACU criteria.  Routine postoperative instructions given.  She was prescribed Oxycodone, Ibuprofen and Colace.  She will follow up in the office in 2-3 weeks for postoperative evaluation.   Jaynie Collins, MD, FACOG Obstetrician & Gynecologist, [facility] for [employer], [facility]

## 2022-08-04 ENCOUNTER — Encounter (HOSPITAL_BASED_OUTPATIENT_CLINIC_OR_DEPARTMENT_OTHER): Payer: Self-pay | Admitting: Obstetrics & Gynecology

## 2022-08-05 ENCOUNTER — Telehealth: Payer: Self-pay | Admitting: *Deleted

## 2022-08-05 NOTE — Telephone Encounter (Signed)
Left patient a message to call and schedule. Needs postop appt with any MD in 2-3 weeks from 08/03/2022 surgery per Dr. Macon Large.

## 2022-08-08 ENCOUNTER — Other Ambulatory Visit: Payer: Self-pay

## 2022-08-08 DIAGNOSIS — O3680X0 Pregnancy with inconclusive fetal viability, not applicable or unspecified: Secondary | ICD-10-CM

## 2022-08-22 ENCOUNTER — Encounter: Payer: Medicaid Other | Admitting: Obstetrics & Gynecology

## 2022-09-01 ENCOUNTER — Ambulatory Visit (INDEPENDENT_AMBULATORY_CARE_PROVIDER_SITE_OTHER): Payer: Medicaid Other | Admitting: Obstetrics and Gynecology

## 2022-09-01 ENCOUNTER — Encounter: Payer: Self-pay | Admitting: Obstetrics and Gynecology

## 2022-09-01 VITALS — BP 134/85 | HR 98 | Ht 62.0 in | Wt 168.0 lb

## 2022-09-01 DIAGNOSIS — Z09 Encounter for follow-up examination after completed treatment for conditions other than malignant neoplasm: Secondary | ICD-10-CM

## 2022-09-01 NOTE — Progress Notes (Signed)
   POST OPERATIVE GYNECOLOGY VISIT  Subjective:  Laurie Morrow is a 27 y.o. G0P0000 4wk s/p hsc, D&C, polypectomy presenting for post op follow up  Doing well w/ no complaints. Recovery was smooth and she has no issues.  Path reviewed - benign endometrial polyp w/ disordered proliferative endometrium  Has questions about fertility. Would likely start TTC within the next year.  Reports irregular periods for 4 months prior to surgery, also had a 30lb weight gain. Workup prior to surgery notable for elevated total T (59.6). Normal pelvic US.   Objective:   Vitals:   09/01/22 1630  BP: 134/85  Pulse: 98  Weight: 168 lb (76.2 kg)  Height: 5\' 2"  (1.575 m)   General:  Alert, oriented and cooperative. Patient is in no acute distress.  Skin: Skin is warm and dry. No rash noted.   Cardiovascular: Normal heart rate noted  Respiratory: Normal respiratory effort, no problems with respiration noted   Assessment and Plan:  Laurie Morrow is a 27 y.o. presenting for post op follow up  1. Postoperative examination No restrictions Reviewed role of weight changes on periods, possibility of PCOS. Discussed importance of cycle tracking and avoiding prolonged lengths of time without a normal period. Reviewed that modest WL (5-10% of BW) can improve ovulatory function She would like to conceive in the next year, but is not actively TTC. She declines any hormonal medications/birth control at this time Start PNV Will follow up in 3-6 months to review cycles +/- preconception counseling  No follow-ups on file.  Laurie Pall, MD

## 2022-09-01 NOTE — Patient Instructions (Signed)
It was nice meeting you today! The polyp was completely removed by Dr. Mervyn Skeeters.   Please track your cycles and follow up with [place] in around 6 months. If you are going more than 3 months without a period, please let us know.

## 2022-12-11 ENCOUNTER — Other Ambulatory Visit: Payer: Self-pay

## 2022-12-11 ENCOUNTER — Inpatient Hospital Stay (HOSPITAL_COMMUNITY): Payer: Medicaid Other

## 2022-12-11 ENCOUNTER — Encounter (HOSPITAL_COMMUNITY): Payer: Self-pay | Admitting: Emergency Medicine

## 2022-12-11 ENCOUNTER — Inpatient Hospital Stay (HOSPITAL_COMMUNITY)
Admission: EM | Admit: 2022-12-11 | Discharge: 2022-12-11 | Disposition: A | Discharge status: Home / Self Care | Payer: Medicaid Other | Attending: Obstetrics and Gynecology | Admitting: Obstetrics and Gynecology

## 2022-12-11 DIAGNOSIS — Z3A08 8 weeks gestation of pregnancy: Secondary | ICD-10-CM

## 2022-12-11 DIAGNOSIS — R109 Unspecified abdominal pain: Secondary | ICD-10-CM | POA: Diagnosis not present

## 2022-12-11 DIAGNOSIS — O3680X0 Pregnancy with inconclusive fetal viability, not applicable or unspecified: Secondary | ICD-10-CM

## 2022-12-11 DIAGNOSIS — O26891 Other specified pregnancy related conditions, first trimester: Secondary | ICD-10-CM | POA: Diagnosis not present

## 2022-12-11 DIAGNOSIS — O209 Hemorrhage in early pregnancy, unspecified: Secondary | ICD-10-CM | POA: Insufficient documentation

## 2022-12-11 DIAGNOSIS — N939 Abnormal uterine and vaginal bleeding, unspecified: Secondary | ICD-10-CM

## 2022-12-11 LAB — CBC WITH DIFFERENTIAL/PLATELET
Abs Immature Granulocytes: 0.01 10*3/uL (ref 0.00–0.07)
Basophils Absolute: 0 10*3/uL (ref 0.0–0.1)
Basophils Relative: 1 %
Eosinophils Absolute: 0.1 10*3/uL (ref 0.0–0.5)
Eosinophils Relative: 2 %
HCT: 38.1 % (ref 36.0–46.0)
Hemoglobin: 12.4 g/dL (ref 12.0–15.0)
Immature Granulocytes: 0 %
Lymphocytes Relative: 47 %
Lymphs Abs: 2.8 10*3/uL (ref 0.7–4.0)
MCH: 31.3 pg (ref 26.0–34.0)
MCHC: 32.5 g/dL (ref 30.0–36.0)
MCV: 96.2 fL (ref 80.0–100.0)
Monocytes Absolute: 0.5 10*3/uL (ref 0.1–1.0)
Monocytes Relative: 8 %
Neutro Abs: 2.5 10*3/uL (ref 1.7–7.7)
Neutrophils Relative %: 42 %
Platelets: 253 10*3/uL (ref 150–400)
RBC: 3.96 MIL/uL (ref 3.87–5.11)
RDW: 13.7 % (ref 11.5–15.5)
WBC: 5.9 10*3/uL (ref 4.0–10.5)
nRBC: 0 % (ref 0.0–0.2)

## 2022-12-11 LAB — COMPREHENSIVE METABOLIC PANEL
ALT: 22 U/L (ref 0–44)
AST: 22 U/L (ref 15–41)
Albumin: 3.6 g/dL (ref 3.5–5.0)
Alkaline Phosphatase: 55 U/L (ref 38–126)
Anion gap: 8 (ref 5–15)
BUN: 6 mg/dL (ref 6–20)
CO2: 21 mmol/L — ABNORMAL LOW (ref 22–32)
Calcium: 8.9 mg/dL (ref 8.9–10.3)
Chloride: 105 mmol/L (ref 98–111)
Creatinine, Ser: 0.76 mg/dL (ref 0.44–1.00)
GFR, Estimated: >60 mL/min (ref >60)
Glucose, Bld: 92 mg/dL (ref 70–99)
Potassium: 3.8 mmol/L (ref 3.5–5.1)
Sodium: 134 mmol/L — ABNORMAL LOW (ref 135–145)
Total Bilirubin: 0.8 mg/dL (ref <1.2)
Total Protein: 6.7 g/dL (ref 6.5–8.1)

## 2022-12-11 LAB — ABO/RH: ABO/RH(D): A POS

## 2022-12-11 LAB — HCG, QUANTITATIVE, PREGNANCY: hCG, Beta Chain, Quant, S: 178 m[IU]/mL — ABNORMAL HIGH (ref <5)

## 2022-12-11 LAB — HCG, SERUM, QUALITATIVE: Preg, Serum: POSITIVE — AB

## 2022-12-11 MED ORDER — OXYCODONE-ACETAMINOPHEN 5-325 MG PO TABS
1.0000 | ORAL_TABLET | Freq: Once | ORAL | Status: AC
  Administered 2022-12-11: 1 via ORAL
  Filled 2022-12-11: qty 1

## 2022-12-11 NOTE — MAU Note (Signed)
Pt is a 28 yo who reports increased bleeding with tissue today.  Came to ED, pregnancy confirmed.

## 2022-12-11 NOTE — ED Triage Notes (Signed)
Pt reports vaginal cramping for the last week and a half. Began bleeding on Monday. States passed large clot today. Unknown pregnancy status.

## 2022-12-11 NOTE — MAU Provider Note (Cosign Needed Addendum)
Chief Complaint:  Vaginal Bleeding   HPI   Event Date/Time   First Provider Initiated Contact with Patient 12/11/22 1854      Laurie Morrow is a 27 y.o. G1P0000 at [redacted]w[redacted]d who presents to maternity admissions as a transfer from the ED, reporting she began cramping and spotting on Monday.Spotting lasted through Wednesday. On Thursday Laurie Morrow reports she began bleeding as if her cycle started. She reports she typically has heavy painful cycles. On Friday and Saturday she reports having very heavy bleeding that was painful. She reports changing a tampon "very often" due to saturating the tampon. Today while at the grocery store she reports having to go to the bathroom for an episode of heavy bleeding where she found a plum size clot. After find the clot, Laurie Morrow reports she came straight to the [facility] ED for treatment. She reports she did not know she was pregnant until today.    Past Medical History:  Diagnosis Date   Abnormal uterine bleeding (AUB) 05/14/2019   Anxiety    B12 deficiency 05/17/2019   Complication of anesthesia    Depression with anxiety 05/14/2019   Iron deficiency 06/04/2019   PONV (postoperative nausea and vomiting)    Strep throat    OB History  Gravida Para Term Preterm AB Living  1 0 0 0 0 0  SAB IAB Ectopic Multiple Live Births  0 0 0 0 0    # Outcome Date GA Lbr Len/2nd Weight Sex Type Anes PTL Lv  1 Current            Past Surgical History:  Procedure Laterality Date   HYSTEROSCOPY WITH D & C N/A 08/03/2022   Procedure: DILATATION AND CURETTAGE /HYSTEROSCOPY/ POLYPECTOMY;  Surgeon: Tereso Newcomer, MD;  Location: [facility];  Service: Gynecology;  Laterality: N/A;   KNEE SURGERY     left   Family History  Problem Relation Age of Onset   Diabetes Mother    Hypertension Mother    Diabetes Maternal Grandmother    Hypertension Maternal Grandmother    Hypertension Maternal Grandfather    Diabetes Maternal Grandfather    Social History    Tobacco Use   Smoking status: Former   Smokeless tobacco: Never  Vaping Use   Vaping status: Former  Substance Use Topics   Alcohol use: Yes    Alcohol/week: 3.0 standard drinks of alcohol    Types: 3 Glasses of wine per week    Comment: twice weekly   Drug use: Not Currently    Types: Marijuana   No Known Allergies No medications prior to admission.    I have reviewed patient's Past Medical Hx, Surgical Hx, Family Hx, Social Hx, medications and allergies.   ROS  Pertinent items noted in HPI and remainder of comprehensive ROS otherwise negative.   PHYSICAL EXAM  Patient Vitals for the past 24 hrs:  BP Temp Temp src Pulse Resp SpO2 Height Weight  12/11/22 2106 126/84 -- -- 82 18 -- -- --  12/11/22 1841 -- 98.6 F (37 C) Oral -- 18 -- -- --  12/11/22 1535 -- -- -- -- -- -- 5\' 2"  (1.575 m) 72.6 kg  12/11/22 1525 121/84 98.6 F (37 C) -- 67 16 100 % -- --    Constitutional: Well-developed, well-nourished female in no acute distress.  Cardiovascular: normal rate & rhythm, warm and well-perfused Respiratory: normal effort, no problems with respiration noted GI: Abd soft, non-tender, non-distended MS: Extremities nontender, no edema, normal  ROM Neurologic: Alert and oriented x 4.  GU: no CVA tenderness Pelvic:Bleeding      Labs: Results for orders placed or performed during the hospital encounter of 12/11/22 (from the past 24 hour(s))  CBC with Differential     Status: None   Collection Time: 12/11/22  4:25 PM  Result Value Ref Range   WBC 5.9 4.0 - 10.5 K/uL   RBC 3.96 3.87 - 5.11 MIL/uL   Hemoglobin 12.4 12.0 - 15.0 g/dL   HCT 16.1 09.6 - 04.5 %   MCV 96.2 80.0 - 100.0 fL   MCH 31.3 26.0 - 34.0 pg   MCHC 32.5 30.0 - 36.0 g/dL   RDW 40.9 81.1 - 91.4 %   Platelets 253 150 - 400 K/uL   nRBC 0.0 0.0 - 0.2 %   Neutrophils Relative % 42 %   Neutro Abs 2.5 1.7 - 7.7 K/uL   Lymphocytes Relative 47 %   Lymphs Abs 2.8 0.7 - 4.0 K/uL   Monocytes Relative 8 %    Monocytes Absolute 0.5 0.1 - 1.0 K/uL   Eosinophils Relative 2 %   Eosinophils Absolute 0.1 0.0 - 0.5 K/uL   Basophils Relative 1 %   Basophils Absolute 0.0 0.0 - 0.1 K/uL   Immature Granulocytes 0 %   Abs Immature Granulocytes 0.01 0.00 - 0.07 K/uL  Comprehensive metabolic panel     Status: Abnormal   Collection Time: 12/11/22  4:25 PM  Result Value Ref Range   Sodium 134 (L) 135 - 145 mmol/L   Potassium 3.8 3.5 - 5.1 mmol/L   Chloride 105 98 - 111 mmol/L   CO2 21 (L) 22 - 32 mmol/L   Glucose, Bld 92 70 - 99 mg/dL   BUN 6 6 - 20 mg/dL   Creatinine, Ser 7.82 0.44 - 1.00 mg/dL   Calcium 8.9 8.9 - 95.6 mg/dL   Total Protein 6.7 6.5 - 8.1 g/dL   Albumin 3.6 3.5 - 5.0 g/dL   AST 22 15 - 41 U/L   ALT 22 0 - 44 U/L   Alkaline Phosphatase 55 38 - 126 U/L   Total Bilirubin 0.8 <1.2 mg/dL   GFR, Estimated >21 >30 mL/min   Anion gap 8 5 - 15  hCG, serum, qualitative     Status: Abnormal   Collection Time: 12/11/22  4:25 PM  Result Value Ref Range   Preg, Serum POSITIVE (A) NEGATIVE  ABO/Rh     Status: None   Collection Time: 12/11/22  4:25 PM  Result Value Ref Range   ABO/RH(D) A POS    No rh immune globuloin      NOT A RH IMMUNE GLOBULIN CANDIDATE, PT RH POSITIVE Performed at [facility], [address], [place], [place] [address]   hCG, quantitative, pregnancy     Status: Abnormal   Collection Time: 12/11/22  4:25 PM  Result Value Ref Range   hCG, Beta Chain, Quant, S 178 (H) <5 mIU/mL    Imaging:  US OB LESS THAN 14 WEEKS WITH OB TRANSVAGINAL  Result Date: 12/11/2022 CLINICAL DATA:  Vaginal bleeding with positive pregnancy test EXAM: OBSTETRIC <14 WK [place] AND TRANSVAGINAL OB US TECHNIQUE: Both transabdominal and transvaginal ultrasound examinations were performed for complete evaluation of the gestation as well as the maternal uterus, adnexal regions, and pelvic cul-de-sac. Transvaginal technique was performed to assess early pregnancy. COMPARISON:  None Available.  FINDINGS: Intrauterine gestational sac: Absent Maternal uterus/adnexae: Uterus and ovaries appear within normal  limits. No free pelvic fluid is seen. IMPRESSION: Positive pregnancy test with no definitive intrauterine or extrauterine gestational sac. Recommend follow-up quantitative B-HCG levels and follow-up US in 14 days to assess viability. This recommendation follows SRU consensus guidelines: Diagnostic Criteria for Nonviable Pregnancy Early in the First Trimester. Malva Limes Med 2013; 161:0960-45. Electronically Signed   By: Alcide Clever M.D.   On: 12/11/2022 20:03    MDM & MAU COURSE  MDM:  MAU Course: Orders Placed This Encounter  Procedures   US OB LESS THAN 14 WEEKS WITH OB TRANSVAGINAL   CBC with Differential   Comprehensive metabolic panel   hCG, serum, qualitative   hCG, quantitative, pregnancy   ABO/Rh   Discharge patient   Meds ordered this encounter  Medications   oxyCODONE-acetaminophen (PERCOCET/ROXICET) 5-325 MG per tablet 1 tablet   Dia Sitter, SNM 12/11/2022 7:15 PM  Attestation of Supervision of Student:  I confirm that I have verified the information documented in the nurse midwife student's note and that I have also personally reperformed the history, physical exam and all medical decision making activities.  I have verified that all services and findings are accurately documented in this student's note; and I agree with management and plan as outlined in the documentation. I have also made any necessary editorial changes.  Report given to and care assumed by Dorathy Daft, CNM @ 2000.   Results for orders placed or performed during the hospital encounter of 12/11/22 (from the past 24 hour(s))  CBC with Differential     Status: None   Collection Time: 12/11/22  4:25 PM  Result Value Ref Range   WBC 5.9 4.0 - 10.5 K/uL   RBC 3.96 3.87 - 5.11 MIL/uL   Hemoglobin 12.4 12.0 - 15.0 g/dL   HCT 40.9 81.1 - 91.4 %   MCV 96.2 80.0 - 100.0 fL   MCH 31.3 26.0 - 34.0 pg    MCHC 32.5 30.0 - 36.0 g/dL   RDW 78.2 95.6 - 21.3 %   Platelets 253 150 - 400 K/uL   nRBC 0.0 0.0 - 0.2 %   Neutrophils Relative % 42 %   Neutro Abs 2.5 1.7 - 7.7 K/uL   Lymphocytes Relative 47 %   Lymphs Abs 2.8 0.7 - 4.0 K/uL   Monocytes Relative 8 %   Monocytes Absolute 0.5 0.1 - 1.0 K/uL   Eosinophils Relative 2 %   Eosinophils Absolute 0.1 0.0 - 0.5 K/uL   Basophils Relative 1 %   Basophils Absolute 0.0 0.0 - 0.1 K/uL   Immature Granulocytes 0 %   Abs Immature Granulocytes 0.01 0.00 - 0.07 K/uL  Comprehensive metabolic panel     Status: Abnormal   Collection Time: 12/11/22  4:25 PM  Result Value Ref Range   Sodium 134 (L) 135 - 145 mmol/L   Potassium 3.8 3.5 - 5.1 mmol/L   Chloride 105 98 - 111 mmol/L   CO2 21 (L) 22 - 32 mmol/L   Glucose, Bld 92 70 - 99 mg/dL   BUN 6 6 - 20 mg/dL   Creatinine, Ser 0.86 0.44 - 1.00 mg/dL   Calcium 8.9 8.9 - 57.8 mg/dL   Total Protein 6.7 6.5 - 8.1 g/dL   Albumin 3.6 3.5 - 5.0 g/dL   AST 22 15 - 41 U/L   ALT 22 0 - 44 U/L   Alkaline Phosphatase 55 38 - 126 U/L   Total Bilirubin 0.8 <1.2 mg/dL   GFR, Estimated >46 >96  mL/min   Anion gap 8 5 - 15  hCG, serum, qualitative     Status: Abnormal   Collection Time: 12/11/22  4:25 PM  Result Value Ref Range   Preg, Serum POSITIVE (A) NEGATIVE  ABO/Rh     Status: None   Collection Time: 12/11/22  4:25 PM  Result Value Ref Range   ABO/RH(D) A POS    No rh immune globuloin      NOT A RH IMMUNE GLOBULIN CANDIDATE, PT RH POSITIVE Performed at [facility], [address], [place], [place] [address]   hCG, quantitative, pregnancy     Status: Abnormal   Collection Time: 12/11/22  4:25 PM  Result Value Ref Range   hCG, Beta Chain, Quant, S 178 (H) <5 mIU/mL   US OB LESS THAN 14 WEEKS WITH OB TRANSVAGINAL  Result Date: 12/11/2022 CLINICAL DATA:  Vaginal bleeding with positive pregnancy test EXAM: OBSTETRIC <14 WK [place] AND TRANSVAGINAL OB US TECHNIQUE: Both transabdominal and transvaginal  ultrasound examinations were performed for complete evaluation of the gestation as well as the maternal uterus, adnexal regions, and pelvic cul-de-sac. Transvaginal technique was performed to assess early pregnancy. COMPARISON:  None Available. FINDINGS: Intrauterine gestational sac: Absent Maternal uterus/adnexae: Uterus and ovaries appear within normal limits. No free pelvic fluid is seen. IMPRESSION: Positive pregnancy test with no definitive intrauterine or extrauterine gestational sac. Recommend follow-up quantitative B-HCG levels and follow-up US in 14 days to assess viability. This recommendation follows SRU consensus guidelines: Diagnostic Criteria for Nonviable Pregnancy Early in the First Trimester. Malva Limes Med 2013; 366:4403-47. Electronically Signed   By: Alcide Clever M.D.   On: 12/11/2022 20:03    Claudette Head, CNM [facility], [facility] 12/11/2022 11:12 PM  ASSESSMENT  - CBC normal, patient hemodynamically stable.  - Quant 178 - [place] results revealed a PUL  - Repeat quant in 48 and plan for discharge.    PLAN     1. Vaginal bleeding   2. Abdominal cramping   3. [redacted] weeks gestation of pregnancy   4. Pregnancy of unknown anatomic location    - Discussed options of PUL, too early to identify, miscarriage vs ectopic.  - Reviewed that this is a pregnancy of unknown location at this point and more information is needed. Reviewed recommendation for repeat Quant in 48 hours and patient agreeable to plan of care.  - Appt made for [facility] on Tuesday at 11am - Reviewed worsening signs and return precautions.  - Patient discharged home in stable condition and may return to MAU as needed.   Jaquia Benedicto Danella Deis) Suzie Portela, MSN, CNM  [facility]  12/11/2022 11:16 PM     Follow-up Information     [facility] Follow up.   Specialty: Obstetrics and Gynecology Why: follow up, as  scheduled Contact information: [address] [place] [address] [phone]

## 2022-12-11 NOTE — ED Provider Triage Note (Signed)
Emergency Medicine Provider Triage Evaluation Note  Laurie Morrow , a 27 y.o. female  was evaluated in triage.  Pt complains of.  Bleeding Thursday, cramping that kept her up at night Going to bathroom and felt something and pushed it out LMP 10/14. Spotting 12/2 then moderate bleeding on thursday  Review of Systems  Positive: Vaginal bleeding Negative: fevers  Physical Exam  BP 121/84 (BP Location: Right Arm)   Pulse 67   Temp 98.6 F (37 C)   Resp 16   Ht 5\' 2"  (1.575 m)   Wt 72.6 kg   SpO2 100%   BMI 29.26 kg/m  Gen:   Awake, no distress   Resp:  Normal effort  MSK:   Moves extremities without difficulty  Other:    Medical Decision Making  Medically screening exam initiated at 4:15 PM.  Appropriate orders placed.  Alferd Patee was informed that the remainder of the evaluation will be completed by another provider, this initial triage assessment does not replace that evaluation, and the importance of remaining in the ED until their evaluation is complete.  Labs ordered   Judithann Sheen, [place] 12/11/22 1626

## 2022-12-12 ENCOUNTER — Inpatient Hospital Stay (HOSPITAL_COMMUNITY)
Admission: AD | Admit: 2022-12-12 | Discharge: 2022-12-12 | Disposition: A | Discharge status: Home / Self Care | Payer: Medicaid Other | Attending: Obstetrics and Gynecology | Admitting: Obstetrics and Gynecology

## 2022-12-12 ENCOUNTER — Encounter (HOSPITAL_COMMUNITY): Payer: Self-pay | Admitting: Obstetrics and Gynecology

## 2022-12-12 ENCOUNTER — Other Ambulatory Visit: Payer: Self-pay

## 2022-12-12 DIAGNOSIS — O26891 Other specified pregnancy related conditions, first trimester: Secondary | ICD-10-CM | POA: Insufficient documentation

## 2022-12-12 DIAGNOSIS — Z3A08 8 weeks gestation of pregnancy: Secondary | ICD-10-CM | POA: Insufficient documentation

## 2022-12-12 DIAGNOSIS — R1030 Lower abdominal pain, unspecified: Secondary | ICD-10-CM | POA: Insufficient documentation

## 2022-12-12 DIAGNOSIS — R0789 Other chest pain: Secondary | ICD-10-CM | POA: Insufficient documentation

## 2022-12-12 DIAGNOSIS — O26851 Spotting complicating pregnancy, first trimester: Secondary | ICD-10-CM | POA: Insufficient documentation

## 2022-12-12 DIAGNOSIS — R110 Nausea: Secondary | ICD-10-CM | POA: Insufficient documentation

## 2022-12-12 DIAGNOSIS — R519 Headache, unspecified: Secondary | ICD-10-CM | POA: Diagnosis not present

## 2022-12-12 DIAGNOSIS — N939 Abnormal uterine and vaginal bleeding, unspecified: Secondary | ICD-10-CM

## 2022-12-12 LAB — CBC WITH DIFFERENTIAL/PLATELET
Abs Immature Granulocytes: 0.01 10*3/uL (ref 0.00–0.07)
Basophils Absolute: 0 10*3/uL (ref 0.0–0.1)
Basophils Relative: 1 %
Eosinophils Absolute: 0.1 10*3/uL (ref 0.0–0.5)
Eosinophils Relative: 3 %
HCT: 36 % (ref 36.0–46.0)
Hemoglobin: 11.9 g/dL — ABNORMAL LOW (ref 12.0–15.0)
Immature Granulocytes: 0 %
Lymphocytes Relative: 46 %
Lymphs Abs: 2.2 10*3/uL (ref 0.7–4.0)
MCH: 31.2 pg (ref 26.0–34.0)
MCHC: 33.1 g/dL (ref 30.0–36.0)
MCV: 94.5 fL (ref 80.0–100.0)
Monocytes Absolute: 0.4 10*3/uL (ref 0.1–1.0)
Monocytes Relative: 9 %
Neutro Abs: 1.9 10*3/uL (ref 1.7–7.7)
Neutrophils Relative %: 41 %
Platelets: 240 10*3/uL (ref 150–400)
RBC: 3.81 MIL/uL — ABNORMAL LOW (ref 3.87–5.11)
RDW: 13.6 % (ref 11.5–15.5)
WBC: 4.7 10*3/uL (ref 4.0–10.5)
nRBC: 0 % (ref 0.0–0.2)

## 2022-12-12 MED ORDER — OXYCODONE-ACETAMINOPHEN 5-325 MG PO TABS
1.0000 | ORAL_TABLET | Freq: Once | ORAL | Status: AC
  Administered 2022-12-12: 1 via ORAL
  Filled 2022-12-12: qty 1

## 2022-12-12 MED ORDER — OXYCODONE-ACETAMINOPHEN 5-325 MG PO TABS: 1.0000 | ORAL_TABLET | Freq: Four times a day (QID) | ORAL | 0 refills | Status: DC | PRN

## 2022-12-12 NOTE — MAU Provider Note (Addendum)
Attestation of Supervision of Student:  I confirm that I have verified the information documented in the medical resident's note and that I have also personally  observed and verified  the history, physical exam and all medical decision making activities.  I have verified that all services and findings are accurately documented in this student's note; and I agree with management and plan as outlined in the documentation. I have also made any necessary editorial changes.   Richardson Landry, CNM [facility] [employer], [facility] 12/12/2022 11:05 AM   History     CSN: [id number]  Arrival date and time: 12/12/22 0816   None     Chief Complaint  Patient presents with   Abdominal Pain   Headache   Laurie Morrow is a 27 y.o. G1P0 at [redacted]w[redacted]d who presents today for vaginal bleeding and headache. She presented to the MAU yesterday for vaginal bleeding and was found to be [redacted] weeks pregnant, unknown location. States that she continued to have vaginal bleeding and lower abdominal pain overnight as well as a headache that was not relieved by Tylenol. Has been wearing thick pads and has not soaked through one and has not passed any clots. Denies lightheadedness and dizziness. Endorses some chest discomfort that started this morning. Endorses sharp lower abdominal pain without radiation that has continued since yesterday. Has been trying to eat and stay well hydrated. Endorses some nausea that she believes is associated with her abdominal pain.   OB History     Gravida  1   Para  0   Term  0   Preterm  0   AB  0   Living  0      SAB  0   IAB  0   Ectopic  0   Multiple  0   Live Births  0           Past Medical History:  Diagnosis Date   Abnormal uterine bleeding (AUB) 05/14/2019   Anxiety    B12 deficiency 05/17/2019   Complication of anesthesia    Depression with anxiety 05/14/2019   Iron deficiency 06/04/2019   PONV (postoperative nausea and  vomiting)    Strep throat     Past Surgical History:  Procedure Laterality Date   HYSTEROSCOPY WITH D & C N/A 08/03/2022   Procedure: DILATATION AND CURETTAGE /HYSTEROSCOPY/ POLYPECTOMY;  Surgeon: Tereso Newcomer, MD;  Location: [facility];  Service: Gynecology;  Laterality: N/A;   KNEE SURGERY     left    Family History  Problem Relation Age of Onset   Diabetes Mother    Hypertension Mother    Diabetes Maternal Grandmother    Hypertension Maternal Grandmother    Hypertension Maternal Grandfather    Diabetes Maternal Grandfather     Social History   Tobacco Use   Smoking status: Former   Smokeless tobacco: Never  Vaping Use   Vaping status: Former  Substance Use Topics   Alcohol use: Yes    Alcohol/week: 3.0 standard drinks of alcohol    Types: 3 Glasses of wine per week    Comment: twice weekly   Drug use: Not Currently    Types: Marijuana    Allergies: No Known Allergies  Medications Prior to Admission  Medication Sig Dispense Refill Last Dose   acetaminophen (TYLENOL) 500 MG tablet Take 2 tablets (1,000 mg total) by mouth every 6 (six) hours as needed for moderate pain or mild pain. (  Patient not taking: Reported on 09/01/2022)      docusate sodium (COLACE) 100 MG capsule Take 1 capsule (100 mg total) by mouth 2 (two) times daily as needed for mild constipation or moderate constipation. (Patient not taking: Reported on 09/01/2022) 30 capsule 2    ibuprofen (ADVIL) 800 MG tablet Take 1 tablet (800 mg total) by mouth 3 (three) times daily with meals as needed for headache, moderate pain or cramping. (Patient not taking: Reported on 09/01/2022) 30 tablet 3    oxyCODONE (OXY IR/ROXICODONE) 5 MG immediate release tablet Take 1 tablet (5 mg total) by mouth every 4 (four) hours as needed for severe pain or breakthrough pain. (Patient not taking: Reported on 09/01/2022) 15 tablet 0     Review of Systems  Constitutional:  Negative for chills, fatigue, fever and  unexpected weight change.  Respiratory:  Negative for cough and shortness of breath.   Cardiovascular:  Negative for chest pain and palpitations.  Gastrointestinal:  Negative for abdominal pain, constipation, diarrhea, nausea and vomiting.  Endocrine: Negative for polydipsia.  Genitourinary:  Positive for pelvic pain and vaginal bleeding. Negative for difficulty urinating, flank pain, frequency and urgency.  Neurological:  Positive for headaches.   See HPI Physical Exam   Blood pressure 128/84, pulse 76, temperature 98.1 F (36.7 C), temperature source Oral, resp. rate 18, height 5\' 2"  (1.575 m), weight 79.3 kg, last menstrual period 10/16/2022, SpO2 98%.  Physical Exam Constitutional:      Appearance: She is well-developed.  Cardiovascular:     Rate and Rhythm: Normal rate and regular rhythm.     Heart sounds: Normal heart sounds.  Pulmonary:     Effort: Pulmonary effort is normal.     Breath sounds: Normal breath sounds.  Abdominal:     General: Bowel sounds are normal.     Palpations: Abdomen is soft.     Tenderness: There is abdominal tenderness in the right lower quadrant, suprapubic area and left lower quadrant. There is no rebound.  Skin:    General: Skin is warm and dry.  Neurological:     Mental Status: She is alert.     Fetal Assessment N/A  MAU Course   Results for orders placed or performed during the hospital encounter of 12/11/22 (from the past 24 hour(s))  CBC with Differential     Status: None   Collection Time: 12/11/22  4:25 PM  Result Value Ref Range   WBC 5.9 4.0 - 10.5 K/uL   RBC 3.96 3.87 - 5.11 MIL/uL   Hemoglobin 12.4 12.0 - 15.0 g/dL   HCT 24.4 01.0 - 27.2 %   MCV 96.2 80.0 - 100.0 fL   MCH 31.3 26.0 - 34.0 pg   MCHC 32.5 30.0 - 36.0 g/dL   RDW 53.6 64.4 - 03.4 %   Platelets 253 150 - 400 K/uL   nRBC 0.0 0.0 - 0.2 %   Neutrophils Relative % 42 %   Neutro Abs 2.5 1.7 - 7.7 K/uL   Lymphocytes Relative 47 %   Lymphs Abs 2.8 0.7 - 4.0 K/uL    Monocytes Relative 8 %   Monocytes Absolute 0.5 0.1 - 1.0 K/uL   Eosinophils Relative 2 %   Eosinophils Absolute 0.1 0.0 - 0.5 K/uL   Basophils Relative 1 %   Basophils Absolute 0.0 0.0 - 0.1 K/uL   Immature Granulocytes 0 %   Abs Immature Granulocytes 0.01 0.00 - 0.07 K/uL  Comprehensive metabolic panel     Status:  Abnormal   Collection Time: 12/11/22  4:25 PM  Result Value Ref Range   Sodium 134 (L) 135 - 145 mmol/L   Potassium 3.8 3.5 - 5.1 mmol/L   Chloride 105 98 - 111 mmol/L   CO2 21 (L) 22 - 32 mmol/L   Glucose, Bld 92 70 - 99 mg/dL   BUN 6 6 - 20 mg/dL   Creatinine, Ser 2.44 0.44 - 1.00 mg/dL   Calcium 8.9 8.9 - 01.0 mg/dL   Total Protein 6.7 6.5 - 8.1 g/dL   Albumin 3.6 3.5 - 5.0 g/dL   AST 22 15 - 41 U/L   ALT 22 0 - 44 U/L   Alkaline Phosphatase 55 38 - 126 U/L   Total Bilirubin 0.8 <1.2 mg/dL   GFR, Estimated >27 >25 mL/min   Anion gap 8 5 - 15  hCG, serum, qualitative     Status: Abnormal   Collection Time: 12/11/22  4:25 PM  Result Value Ref Range   Preg, Serum POSITIVE (A) NEGATIVE  ABO/Rh     Status: None   Collection Time: 12/11/22  4:25 PM  Result Value Ref Range   ABO/RH(D) A POS    No rh immune globuloin      NOT A RH IMMUNE GLOBULIN CANDIDATE, PT RH POSITIVE Performed at [facility], [address] [place], [place] [address]   hCG, quantitative, pregnancy     Status: Abnormal   Collection Time: 12/11/22  4:25 PM  Result Value Ref Range   hCG, Beta Chain, Quant, S 178 (H) <5 mIU/mL   US OB LESS THAN 14 WEEKS WITH OB TRANSVAGINAL  Result Date: 12/11/2022 CLINICAL DATA:  Vaginal bleeding with positive pregnancy test EXAM: OBSTETRIC <14 WK [place] AND TRANSVAGINAL OB US TECHNIQUE: Both transabdominal and transvaginal ultrasound examinations were performed for complete evaluation of the gestation as well as the maternal uterus, adnexal regions, and pelvic cul-de-sac. Transvaginal technique was performed to assess early pregnancy. COMPARISON:  None  Available. FINDINGS: Intrauterine gestational sac: Absent Maternal uterus/adnexae: Uterus and ovaries appear within normal limits. No free pelvic fluid is seen. IMPRESSION: Positive pregnancy test with no definitive intrauterine or extrauterine gestational sac. Recommend follow-up quantitative B-HCG levels and follow-up US in 14 days to assess viability. This recommendation follows SRU consensus guidelines: Diagnostic Criteria for Nonviable Pregnancy Early in the First Trimester. Malva Limes Med 2013; 034:7425-95. Electronically Signed   By: Alcide Clever M.D.   On: 12/11/2022 20:03    MDM PE Labs: CBC w diff   Assessment and Plan  27 year old G1P0  PUL at 8 weeks  Vaginal bleeding, abdominal pain, headache -Exam findings discussed. -no signs of active hemorrhage or infection currently -will give Percocet 5-325 and monitor for improvement in symptoms -Hgb slightly decreased to 11.9 from 12.4 yesterday  10:45- Symptoms much improved with Percocet, denies ongoing pain. Patient feels comfortable discharging home now. Has appointment for repeat Hcg tomorrow morning.   Lorayne Bender PGY-1, Family Medicine 12/12/2022, 8:41 AM   - Was seen yesterday for similar complaint, without component of HA.   Lamont Snowball, MSN, CNM, RNC-OB Certified Nurse Midwife, [facility] 12/12/2022 11:08 AM

## 2022-12-12 NOTE — Progress Notes (Signed)
Encounter opened in error

## 2022-12-12 NOTE — MAU Note (Signed)
Laurie Morrow is a 27 y.o. at [redacted]w[redacted]d here in MAU reporting: she's lower abdominal cramping and a HA.  Reports took Tylenol last night between 2300 and midnight for HA, went to sleep and woke up with HA.  States was unable to return to sleep.  Also c/o moderate VB with small clots LMP: NA Onset of complaint: last night Pain score: 5 HA & 9 cramping Vitals:   12/12/22 0832  BP: 128/84  Pulse: 76  Resp: 18  Temp: 98.1 F (36.7 C)  SpO2: 98%     FHT:NA Lab orders placed from triage:   None

## 2022-12-13 ENCOUNTER — Ambulatory Visit: Payer: Self-pay

## 2022-12-13 VITALS — BP 111/63 | HR 71 | Ht 62.0 in | Wt 173.7 lb

## 2022-12-13 DIAGNOSIS — O3680X0 Pregnancy with inconclusive fetal viability, not applicable or unspecified: Secondary | ICD-10-CM

## 2022-12-13 DIAGNOSIS — Z3A08 8 weeks gestation of pregnancy: Secondary | ICD-10-CM

## 2022-12-13 LAB — BETA HCG QUANT (REF LAB): hCG Quant: 60 m[IU]/mL

## 2022-12-13 NOTE — Progress Notes (Signed)
Beta HCG Follow-up Visit  SERGIO DUCHNOWSKI presents to [facility] for follow-up beta HCG lab. She was seen in MAU for abdominal pain on 12/11/22. Patient reports pain, bleeding today--pain this morning but has subsided with Rx pain medication; bleeding described as little clots, but not heavy bleeding. Discussed with patient that we are following beta HCG levels today. Results will be back in approximately 2 hours. Valid contact number for patient confirmed. I will call the patient with results.   Beta HCG results: On 12/11/22 178  Today  60      Results and patient history reviewed with S Warren-Hill CNM, who states patient to return on Friday for non-stat beta hcg; will re-evaluate need for [place] or r/p bloodwork after results from [facility] on Friday come in. Patient called and informed of plan for follow-up.  Patient scheduled for non stat beta blood draw on Friday, 12/13 at 1030.   Meryl Crutch 12/13/2022 11:05 AM

## 2022-12-16 ENCOUNTER — Other Ambulatory Visit: Payer: Medicaid Other

## 2022-12-19 ENCOUNTER — Other Ambulatory Visit: Payer: Medicaid Other

## 2022-12-20 ENCOUNTER — Other Ambulatory Visit: Payer: Medicaid Other

## 2022-12-20 ENCOUNTER — Other Ambulatory Visit: Payer: Self-pay

## 2022-12-20 DIAGNOSIS — O3680X0 Pregnancy with inconclusive fetal viability, not applicable or unspecified: Secondary | ICD-10-CM

## 2022-12-21 LAB — BETA HCG QUANT (REF LAB): hCG Quant: 6 m[IU]/mL

## 2022-12-22 ENCOUNTER — Other Ambulatory Visit: Payer: Self-pay | Admitting: Certified Nurse Midwife

## 2022-12-22 ENCOUNTER — Encounter: Payer: Self-pay | Admitting: Certified Nurse Midwife

## 2022-12-22 DIAGNOSIS — O039 Complete or unspecified spontaneous abortion without complication: Secondary | ICD-10-CM

## 2022-12-29 ENCOUNTER — Other Ambulatory Visit: Payer: Self-pay

## 2022-12-29 ENCOUNTER — Ambulatory Visit: Payer: Medicaid Other

## 2022-12-29 DIAGNOSIS — O039 Complete or unspecified spontaneous abortion without complication: Secondary | ICD-10-CM

## 2022-12-29 DIAGNOSIS — Z3A08 8 weeks gestation of pregnancy: Secondary | ICD-10-CM

## 2022-12-29 NOTE — Progress Notes (Signed)
Patient here for non stat beta hcg levels. Last beta hCG levels on 12/17 was 6. Per physician Lamont Snowball CNM, repeat lab today to ensure it drops to 0. Lab draw obtained. Notified patient that she will be reached out by physician regarding these results in a couple of days.    Marcelino Duster, RN

## 2022-12-30 LAB — BETA HCG QUANT (REF LAB): hCG Quant: <1 m[IU]/mL

## 2023-01-01 ENCOUNTER — Encounter: Payer: Self-pay | Admitting: Certified Nurse Midwife

## 2023-03-10 ENCOUNTER — Encounter (HOSPITAL_COMMUNITY): Payer: Self-pay

## 2023-03-10 ENCOUNTER — Ambulatory Visit (HOSPITAL_COMMUNITY)
Admission: EM | Admit: 2023-03-10 | Discharge: 2023-03-10 | Disposition: A | Discharge status: Home / Self Care | Attending: Emergency Medicine | Admitting: Emergency Medicine

## 2023-03-10 DIAGNOSIS — Z3202 Encounter for pregnancy test, result negative: Secondary | ICD-10-CM

## 2023-03-10 LAB — POCT URINE PREGNANCY: Preg Test, Ur: NEGATIVE

## 2023-03-10 NOTE — ED Provider Notes (Signed)
 [facility]    CSN: [id number] Arrival date & time: 03/10/23  1314      History   Chief Complaint Chief Complaint  Patient presents with   Possible Pregnancy    HPI Laurie Morrow is a 28 y.o. female.  Here for pregnancy test, concerned for late menses Had two negative tests at home. Last cycle was 01/16/2023 Having some breast tenderness. No nausea/vomiting or abd pain. Reports history of miscarriage - 3 months ago. Had negative tests before that as well.  Has an ob/gyn   Per chart review, history of AUB and endometrial polyp (removed)  Past Medical History:  Diagnosis Date   Abnormal uterine bleeding (AUB) 05/14/2019   Anxiety    B12 deficiency 05/17/2019   Complication of anesthesia    Depression with anxiety 05/14/2019   Iron deficiency 06/04/2019   PONV (postoperative nausea and vomiting)    Strep throat     Patient Active Problem List   Diagnosis Date Noted   Endometrial polyp 06/08/2022   Abnormal uterine bleeding (AUB) 05/14/2019    Past Surgical History:  Procedure Laterality Date   HYSTEROSCOPY WITH D & C N/A 08/03/2022   Procedure: DILATATION AND CURETTAGE /HYSTEROSCOPY/ POLYPECTOMY;  Surgeon: Tereso Newcomer, MD;  Location: [facility];  Service: Gynecology;  Laterality: N/A;   KNEE SURGERY     left    OB History     Gravida  1   Para  0   Term  0   Preterm  0   AB  0   Living  0      SAB  0   IAB  0   Ectopic  0   Multiple  0   Live Births  0            Home Medications    Prior to Admission medications   Medication Sig Start Date End Date Taking? Authorizing Provider  oxyCODONE-acetaminophen (PERCOCET) 5-325 MG tablet Take 1 tablet by mouth every 6 (six) hours as needed for severe pain (pain score 7-10). Patient not taking: Reported on 12/29/2022 12/12/22 12/12/23  Lorayne Bender, MD    Family History Family History  Problem Relation Age of Onset   Diabetes Mother    Hypertension  Mother    Diabetes Maternal Grandmother    Hypertension Maternal Grandmother    Hypertension Maternal Grandfather    Diabetes Maternal Grandfather     Social History Social History   Tobacco Use   Smoking status: Never   Smokeless tobacco: Never  Vaping Use   Vaping status: Former  Substance Use Topics   Alcohol use: Not Currently    Alcohol/week: 3.0 standard drinks of alcohol    Types: 3 Glasses of wine per week    Comment: twice weekly   Drug use: Not Currently    Types: Marijuana     Allergies   Patient has no known allergies.   Review of Systems Review of Systems Per HPI  Physical Exam Triage Vital Signs ED Triage Vitals  Encounter Vitals Group     BP 03/10/23 1427 115/78     Systolic BP Percentile --      Diastolic BP Percentile --      Pulse Rate 03/10/23 1427 90     Resp 03/10/23 1427 16     Temp 03/10/23 1427 99.2 F (37.3 C)     Temp Source 03/10/23 1427 Oral     SpO2 03/10/23 1427 99 %  Weight 03/10/23 1427 170 lb (77.1 kg)     Height 03/10/23 1427 5\' 2"  (1.575 m)     Head Circumference --      Peak Flow --      Pain Score 03/10/23 1426 4     Pain Loc --      Pain Education --      Exclude from Growth Chart --    No data found.  Updated Vital Signs BP 115/78 (BP Location: Right Arm)   Pulse 90   Temp 99.2 F (37.3 C) (Oral)   Resp 16   Ht 5\' 2"  (1.575 m)   Wt 170 lb (77.1 kg)   LMP 01/16/2023 (Exact Date) Comment: negative HCG quantitiative 06/13/22  SpO2 99%   Breastfeeding No   BMI 31.09 kg/m   Visual Acuity Right Eye Distance:   Left Eye Distance:   Bilateral Distance:    Right Eye Near:   Left Eye Near:    Bilateral Near:     Physical Exam Vitals and nursing note reviewed.  Constitutional:      General: She is not in acute distress.    Appearance: Normal appearance.  Cardiovascular:     Rate and Rhythm: Normal rate and regular rhythm.     Heart sounds: Normal heart sounds.  Pulmonary:     Effort: Pulmonary  effort is normal.     Breath sounds: Normal breath sounds.  Neurological:     Mental Status: She is alert and oriented to person, place, and time.      UC Treatments / Results  Labs (all labs ordered are listed, but only abnormal results are displayed) Labs Reviewed  POCT URINE PREGNANCY    EKG  Radiology No results found.  Procedures Procedures (including critical care time)  Medications Ordered in UC Medications - No data to display  Initial Impression / Assessment and Plan / UC Course  I have reviewed the triage vital signs and the nursing notes.  Pertinent labs & imaging results that were available during my care of the patient were reviewed by me and considered in my medical decision making (see chart for details).  UPT negative Advised contact ob/gyn for follow up Patient agrees to plan, no questions  Final Clinical Impressions(s) / UC Diagnoses   Final diagnoses:  Negative pregnancy test     Discharge Instructions      Please contact your ob/gyn to make an appointment for follow up    ED Prescriptions   None    PDMP not reviewed this encounter.   Yeshua Stryker, Lurena Joiner, [place] 03/10/23 1458

## 2023-03-10 NOTE — Discharge Instructions (Addendum)
 Please contact your ob/gyn to make an appointment for follow up

## 2023-03-10 NOTE — ED Triage Notes (Signed)
 Patient states her period is late. Her LMP was 01/16/23. Patient having slight breast tenderness but denies nausea. Patient states she had 2 negative home tests. States history of miscarriage this past December.

## 2023-06-18 ENCOUNTER — Ambulatory Visit
Admission: EM | Admit: 2023-06-18 | Discharge: 2023-06-18 | Disposition: A | Discharge status: Home / Self Care | Attending: Physician Assistant | Admitting: Physician Assistant

## 2023-06-18 ENCOUNTER — Encounter: Payer: Self-pay | Admitting: *Deleted

## 2023-06-18 DIAGNOSIS — M549 Dorsalgia, unspecified: Secondary | ICD-10-CM | POA: Diagnosis not present

## 2023-06-18 MED ORDER — CYCLOBENZAPRINE HCL 10 MG PO TABS: 10.0000 mg | ORAL_TABLET | Freq: Two times a day (BID) | ORAL | 0 refills | Status: DC | PRN

## 2023-06-18 MED ORDER — PREDNISONE 20 MG PO TABS: 40.0000 mg | ORAL_TABLET | Freq: Every day | ORAL | 0 refills | Status: AC
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
## 2023-06-20 ENCOUNTER — Emergency Department (HOSPITAL_COMMUNITY)
Admission: EM | Admit: 2023-06-20 | Discharge: 2023-06-20 | Disposition: A | Discharge status: Home / Self Care | Attending: Emergency Medicine | Admitting: Emergency Medicine

## 2023-06-20 ENCOUNTER — Other Ambulatory Visit: Payer: Self-pay

## 2023-06-20 DIAGNOSIS — Y92410 Unspecified street and highway as the place of occurrence of the external cause: Secondary | ICD-10-CM | POA: Diagnosis not present

## 2023-06-20 DIAGNOSIS — M545 Low back pain, unspecified: Secondary | ICD-10-CM | POA: Insufficient documentation

## 2023-06-20 DIAGNOSIS — S161XXA Strain of muscle, fascia and tendon at neck level, initial encounter: Secondary | ICD-10-CM | POA: Insufficient documentation

## 2023-06-20 DIAGNOSIS — S199XXA Unspecified injury of neck, initial encounter: Secondary | ICD-10-CM | POA: Diagnosis present

## 2023-06-20 MED ORDER — KETOROLAC TROMETHAMINE 15 MG/ML IJ SOLN
INTRAMUSCULAR | Status: AC
  Administered 2023-06-20: 30 mg via INTRAMUSCULAR
  Filled 2023-06-20: qty 1

## 2023-06-20 MED ORDER — KETOROLAC TROMETHAMINE 15 MG/ML IJ SOLN
30.0000 mg | Freq: Once | INTRAMUSCULAR | Status: AC
  Filled 2023-06-20: qty 2

## 2023-06-20 MED ORDER — METHOCARBAMOL 500 MG PO TABS: 500.0000 mg | ORAL_TABLET | Freq: Two times a day (BID) | ORAL | 0 refills | Status: DC

## 2023-06-20 MED ORDER — NAPROXEN 375 MG PO TABS: 375.0000 mg | ORAL_TABLET | Freq: Two times a day (BID) | ORAL | 0 refills | Status: DC

## 2023-06-20 MED ORDER — KETOROLAC TROMETHAMINE 15 MG/ML IJ SOLN: 30.0000 mg | Freq: Once | INTRAMUSCULAR | Status: DC

## 2023-06-20 MED ORDER — NAPROXEN 500 MG PO TABS: 500.0000 mg | ORAL_TABLET | Freq: Two times a day (BID) | ORAL | 0 refills | Status: DC
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
## 2023-08-12 ENCOUNTER — Emergency Department (HOSPITAL_COMMUNITY)
Admission: EM | Admit: 2023-08-12 | Discharge: 2023-08-12 | Disposition: A | Discharge status: Home / Self Care | Attending: Emergency Medicine | Admitting: Emergency Medicine

## 2023-08-12 DIAGNOSIS — J029 Acute pharyngitis, unspecified: Secondary | ICD-10-CM | POA: Diagnosis present

## 2023-08-12 DIAGNOSIS — J020 Streptococcal pharyngitis: Secondary | ICD-10-CM | POA: Diagnosis not present

## 2023-08-12 LAB — GROUP A STREP BY PCR: Group A Strep by PCR: DETECTED — AB

## 2023-08-12 MED ORDER — AMOXICILLIN 500 MG PO CAPS: 500.0000 mg | ORAL_CAPSULE | Freq: Two times a day (BID) | ORAL | 0 refills | Status: DC

## 2023-08-12 NOTE — Discharge Instructions (Addendum)
 You have strep pharyngitis.  Amoxicillin  sent into your pharmacy.  Follow-up with your primary care doctor.  Return for any emergent symptoms.

## 2023-08-12 NOTE — ED Triage Notes (Signed)
 Patient here from home reporting sore throat that started last night .

## 2023-08-12 NOTE — ED Provider Notes (Signed)
 [place] EMERGENCY DEPARTMENT AT [facility] Provider Note   CSN: [id number] Arrival date & time: 08/12/23  1543     Patient presents with: Sore Throat   Laurie Morrow is a 28 y.o. female.   28 year old female presents today for concern of pharyngitis.  She has history of recurrent strep pharyngitis.  She is requesting a penicillin  IM injection.  Denies any other symptoms.  No drooling or difficulty tolerating p.o. intake.  Does have some pain with p.o. intake.  No fever.  The history is provided by the patient. No language interpreter was used.       Prior to Admission medications   Medication Sig Start Date End Date Taking? Authorizing Provider  amoxicillin  (AMOXIL ) 500 MG capsule Take 1 capsule (500 mg total) by mouth 2 (two) times daily. 08/12/23  Yes Carlous Olivares, PA-C  cyclobenzaprine  (FLEXERIL ) 10 MG tablet Take 1 tablet (10 mg total) by mouth 2 (two) times daily as needed for muscle spasms. 06/18/23   Billy Asberry FALCON, PA-C  methocarbamol  (ROBAXIN ) 500 MG tablet Take 1 tablet (500 mg total) by mouth 2 (two) times daily. 06/20/23   Bauer, Collin S, PA-C  naproxen  (NAPROSYN ) 500 MG tablet Take 1 tablet (500 mg total) by mouth 2 (two) times daily. 06/20/23   Bauer, Collin S, PA-C    Allergies: Patient has no known allergies.    Review of Systems  Constitutional:  Negative for chills and fever.  HENT:  Positive for drooling and sore throat. Negative for trouble swallowing.   Respiratory:  Negative for cough and shortness of breath.   Neurological:  Negative for light-headedness.  All other systems reviewed and are negative.   Updated Vital Signs BP 126/76 (BP Location: Right Arm)   Pulse (!) 101   Temp 99.2 F (37.3 C) (Oral)   Resp 16   LMP 10/16/2022 (Exact Date) Comment: negative HCG quantitiative 06/13/22  SpO2 99%   Physical Exam Vitals and nursing note reviewed.  Constitutional:      General: She is not in acute distress.    Appearance: Normal  appearance. She is not ill-appearing.  HENT:     Head: Normocephalic and atraumatic.     Nose: Nose normal.     Mouth/Throat:     Mouth: Mucous membranes are moist.     Pharynx: Oropharyngeal exudate and posterior oropharyngeal erythema present.     Comments: Without peritonsillar abscess, retropharyngeal abscess, Ludwig's angina Eyes:     Conjunctiva/sclera: Conjunctivae normal.  Cardiovascular:     Rate and Rhythm: Normal rate and regular rhythm.  Pulmonary:     Effort: Pulmonary effort is normal. No respiratory distress.  Musculoskeletal:        General: No deformity. Normal range of motion.  Skin:    Findings: No rash.  Neurological:     Mental Status: She is alert.     (all labs ordered are listed, but only abnormal results are displayed) Labs Reviewed  GROUP A STREP BY PCR - Abnormal; Notable for the following components:      Result Value   Group A Strep by PCR DETECTED (*)    All other components within normal limits    EKG: None  Radiology: No results found.   Procedures   Medications Ordered in the ED - No data to display  Medical Decision Making Risk Prescription drug management.   28 year old female presents today for concern of strep pharyngitis. Strep test is positive. She is requesting IM penicillin  injection. IM penicillin  G is of limited supply due to recent recall.  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  Will treat patient with amoxicillin .  She is agreeable. Discharged in stable condition. Supportive care discussed.   Final diagnoses:  Strep pharyngitis    ED Discharge Orders          Ordered    amoxicillin  (AMOXIL ) 500 MG capsule  2 times daily        08/12/23 1646               Hildegard Loge, PA-C 08/12/23 1711    Yolande Lamar BROCKS, MD 08/18/23 1023

## 2023-09-12 ENCOUNTER — Telehealth: Payer: Self-pay | Admitting: *Deleted

## 2023-09-12 NOTE — Telephone Encounter (Signed)
 Returned call from 8:32 AM. Left patient a message to call and schedule.

## 2023-09-25 ENCOUNTER — Ambulatory Visit (INDEPENDENT_AMBULATORY_CARE_PROVIDER_SITE_OTHER)

## 2023-09-25 VITALS — BP 126/76 | HR 87 | Ht 62.0 in | Wt 169.1 lb

## 2023-09-25 DIAGNOSIS — Z3201 Encounter for pregnancy test, result positive: Secondary | ICD-10-CM | POA: Diagnosis not present

## 2023-09-25 DIAGNOSIS — N926 Irregular menstruation, unspecified: Secondary | ICD-10-CM

## 2023-09-25 LAB — POCT URINE PREGNANCY: Preg Test, Ur: POSITIVE — AB

## 2023-09-25 NOTE — Progress Notes (Signed)
 Laurie Morrow here for a UPT. Pt had a positive upt at home. LMP is 07/20/2023.     UPT in office Positive.    Reviewed medications and informed to start a PNV, if not already. Pt to follow up in 3 weeks for New OB visit. Patient given safe over the counter medications during pregnancy. Reviewed when to notify OB and or go to [facility]U.   Silvano LELON Piano, RN

## 2023-09-28 ENCOUNTER — Other Ambulatory Visit

## 2023-10-04 ENCOUNTER — Other Ambulatory Visit: Payer: Self-pay

## 2023-10-04 DIAGNOSIS — O3680X0 Pregnancy with inconclusive fetal viability, not applicable or unspecified: Secondary | ICD-10-CM

## 2023-10-05 ENCOUNTER — Other Ambulatory Visit: Payer: Self-pay

## 2023-10-05 ENCOUNTER — Encounter: Payer: Self-pay | Admitting: Family Medicine

## 2023-10-05 ENCOUNTER — Ambulatory Visit (INDEPENDENT_AMBULATORY_CARE_PROVIDER_SITE_OTHER)

## 2023-10-05 ENCOUNTER — Other Ambulatory Visit: Payer: Self-pay | Admitting: Obstetrics and Gynecology

## 2023-10-05 DIAGNOSIS — Z3491 Encounter for supervision of normal pregnancy, unspecified, first trimester: Secondary | ICD-10-CM | POA: Diagnosis not present

## 2023-10-05 DIAGNOSIS — O3680X0 Pregnancy with inconclusive fetal viability, not applicable or unspecified: Secondary | ICD-10-CM

## 2023-10-05 DIAGNOSIS — Z3A10 10 weeks gestation of pregnancy: Secondary | ICD-10-CM | POA: Diagnosis not present

## 2023-10-09 ENCOUNTER — Ambulatory Visit: Payer: Self-pay | Admitting: Obstetrics and Gynecology

## 2023-10-12 ENCOUNTER — Ambulatory Visit (INDEPENDENT_AMBULATORY_CARE_PROVIDER_SITE_OTHER): Admitting: Obstetrics and Gynecology

## 2023-10-12 ENCOUNTER — Encounter: Payer: Self-pay | Admitting: Obstetrics and Gynecology

## 2023-10-12 ENCOUNTER — Other Ambulatory Visit (HOSPITAL_COMMUNITY)
Admission: RE | Admit: 2023-10-12 | Discharge: 2023-10-12 | Disposition: A | Discharge status: Home / Self Care | Source: Ambulatory Visit | Attending: Obstetrics and Gynecology | Admitting: Obstetrics and Gynecology

## 2023-10-12 VITALS — BP 110/73 | HR 80 | Wt 162.0 lb

## 2023-10-12 DIAGNOSIS — Z3401 Encounter for supervision of normal first pregnancy, first trimester: Secondary | ICD-10-CM

## 2023-10-12 DIAGNOSIS — Z3A12 12 weeks gestation of pregnancy: Secondary | ICD-10-CM | POA: Diagnosis not present

## 2023-10-12 DIAGNOSIS — O0990 Supervision of high risk pregnancy, unspecified, unspecified trimester: Secondary | ICD-10-CM | POA: Insufficient documentation

## 2023-10-12 DIAGNOSIS — Z3400 Encounter for supervision of normal first pregnancy, unspecified trimester: Secondary | ICD-10-CM | POA: Insufficient documentation

## 2023-10-12 DIAGNOSIS — O219 Vomiting of pregnancy, unspecified: Secondary | ICD-10-CM | POA: Diagnosis not present

## 2023-10-12 MED ORDER — DOXYLAMINE-PYRIDOXINE 10-10 MG PO TBEC: 2.0000 | DELAYED_RELEASE_TABLET | Freq: Every day | ORAL | 2 refills | Status: DC

## 2023-10-12 MED ORDER — ASPIRIN 81 MG PO TBEC: 81.0000 mg | DELAYED_RELEASE_TABLET | Freq: Every day | ORAL | 12 refills | Status: AC

## 2023-10-12 NOTE — Progress Notes (Addendum)
 Subjective:   Laurie Morrow is a 28 y.o. G2P0010 at [redacted]w[redacted]d by LMP c/w [redacted]w[redacted]d US  being seen today for her first obstetrical visit.  Her obstetrical history is significant for SAB x 1 and has Abnormal uterine bleeding (AUB); Endometrial polyp; and Supervision of normal first pregnancy on their problem list.. Patient does intend to breast feed. Pregnancy history fully reviewed.  Patient reports nausea throughout the day with decreased appetite.  HISTORY: OB History  Gravida Para Term Preterm AB Living  2 0 0 0 1 0  SAB IAB Ectopic Multiple Live Births  1 0 0 0 0    # Outcome Date GA Lbr Len/2nd Weight Sex Type Anes PTL Lv  2 Current           1 SAB 12/2022           Past Medical History:  Diagnosis Date   Abnormal uterine bleeding (AUB) 05/14/2019   Anxiety    B12 deficiency 05/17/2019   Complication of anesthesia    Depression with anxiety 05/14/2019   Iron  deficiency 06/04/2019   PONV (postoperative nausea and vomiting)    Strep throat    Past Surgical History:  Procedure Laterality Date   HYSTEROSCOPY WITH D & C N/A 08/03/2022   Procedure: DILATATION AND CURETTAGE /HYSTEROSCOPY/ POLYPECTOMY;  Surgeon: Herchel Gloris LABOR, MD;  Location: [facility];  Service: Gynecology;  Laterality: N/A;   KNEE SURGERY     left   Family History  Problem Relation Age of Onset   Diabetes Mother    Hypertension Mother    Diabetes Maternal Grandmother    Hypertension Maternal Grandmother    Hypertension Maternal Grandfather    Diabetes Maternal Grandfather    Social History   Tobacco Use   Smoking status: Never   Smokeless tobacco: Never  Vaping Use   Vaping status: Former  Substance Use Topics   Alcohol use: Not Currently    Alcohol/week: 3.0 standard drinks of alcohol    Types: 3 Glasses of wine per week    Comment: twice weekly   Drug use: Not Currently    Types: Marijuana   No Known Allergies Current Outpatient Medications on File Prior to Visit   Medication Sig Dispense Refill   Prenatal Vit-Fe Fumarate-FA (PRENATAL PO) Take 1 tablet by mouth daily.     amoxicillin  (AMOXIL ) 500 MG capsule Take 1 capsule (500 mg total) by mouth 2 (two) times daily. 20 capsule 0   cyclobenzaprine  (FLEXERIL ) 10 MG tablet Take 1 tablet (10 mg total) by mouth 2 (two) times daily as needed for muscle spasms. 20 tablet 0   methocarbamol  (ROBAXIN ) 500 MG tablet Take 1 tablet (500 mg total) by mouth 2 (two) times daily. 20 tablet 0   naproxen  (NAPROSYN ) 500 MG tablet Take 1 tablet (500 mg total) by mouth 2 (two) times daily. 20 tablet 0   No current facility-administered medications on file prior to visit.   Exam   Vitals:   10/12/23 1030  BP: 110/73  Pulse: 80  Weight: 73.5 kg   Fetal Heart Rate (bpm): 161  Constitutional: well developed, well nourished, no distress HEENT: normocephalic CV: normal rate Pulm/chest wall: normal rate and effort Abdomen: soft Neuro: alert and oriented x 3 Skin: warm, dry Psych: affect normal  Assessment:   Pregnancy: G2P0010 Patient Active Problem List   Diagnosis Date Noted   Supervision of normal first pregnancy 10/12/2023   Endometrial polyp 06/08/2022   Abnormal uterine  bleeding (AUB) 05/14/2019   Plan:   1. Encounter for supervision of normal first pregnancy in first trimester (Primary) - Interested in midwifery care, plan to schedule with CNM when available - Desires waterbirth, info given in AVS - Considering home birth, explained this is not offered by [facility], area home birth midwives info given, offered parallel care if she decides to pursue home birth - Requests information for doulas and placenta encapsulation - CBC/D/Plt+RPR+Rh+ABO+RubIgG... - Culture, OB Urine - Cervicovaginal ancillary only - US  MFM OB DETAIL +14 WK; Future  - PANORAMA PRENATAL TEST - [facility] Basic Panel - Hemoglobin A1c  2. [redacted] weeks gestation of pregnancy - Begin aspirin 81 mg daily for preeclampsia prevention - aspirin  EC 81 MG tablet; Take 1 tablet (81 mg total) by mouth daily. Swallow whole.  Dispense: 30 tablet; Refill: 12  3. Nausea and vomiting during pregnancy - Nausea present throughout the day, worsens with food, no vomiting - Discussed diet changes and starting Diclegis - Doxylamine-Pyridoxine (DICLEGIS) 10-10 MG TBEC; Take 2 tablets by mouth at bedtime. May add 1 tablet at breakfast and 1 tablet at lunch if needed.  Dispense: 100 tablet; Refill: 2   Initial labs drawn. Continue prenatal vitamins. Discussed and offered genetic screening options, including Quad screen/AFP, NIPS testing, and option to decline testing. Benefits/risks/alternatives reviewed. Pt aware that anatomy US  is form of genetic screening with lower accuracy in detecting trisomies than blood work.  Pt chooses: NIPS and carrier screening today.  Ultrasound discussed; fetal anatomic survey: ordered. Problem list reviewed and updated. The nature of [facility] with multiple MDs and other Advanced Practice Providers was explained to patient; also emphasized that residents, students are part of our team. Routine obstetric precautions reviewed.  Return in about 4 weeks (around 11/09/2023) for OB VISIT (MD or APP).  Vernell Ruddle, SNM 10/12/2023 1:02 PM

## 2023-10-12 NOTE — Patient Instructions (Addendum)
 [facility]: Rumalda Cuba, CNM [email or web address]  [facility]: Pershing Buddy, CNM [email or web address] [phone]  [facility]: Cortney Long, CPM [email or web address] [phone]  [facility] [place]: Inocente Mettle, CNM [email or web address] [phone] Placenta encapsulation is the process of turning a fresh/frozen placenta into capsules for easier ingestion. Encapsulators pick up the placenta shortly after delivery and clean/dry it, turn it into a powder and encapsulate in capsules. Encapsulation is reported (but not clinically studied or FDA/MD approved) to improve iron  stores and milk supply, ease postpartum recovery and prevent postpartum mood disorders. We recommend extreme caution if patients are GBS positive, and do not recommend if patients are diagnosed with chorioamnionitis during labor (which usually means we would like to send the placenta to pathology for testing).  Here is a list of local placenta encapsulators:  Ase9 Jalayah Jeff [email or web address]  [email or web address]  [facility] [place], [place] Tiffany Lavelle [email or web address]  [facility] Doula Angelica Knight [phone] [email or web address], Ezella Alder [address] [phone] [email or web address]  Eboni Allen [email or web address]  Passionate Engineer, [email or web address]  [facility] D[facility] Fayrene James [phone] [email or web address]  [facility] Wyn Blumenthal [phone] [email or web address]     Considering Waterbirth? Guide for patients at Center for [facility]) Why consider waterbirth? Gentle birth for babies  Less pain medicine used in labor  May allow for passive descent/less pushing  May reduce perineal tears   More mobility and instinctive maternal position changes  Increased maternal relaxation   Is waterbirth safe? What are the risks of infection, drowning or other complications? Infection:  Very low risk (3.7 % for tub vs 4.8% for bed)  7 in 8000 waterbirths with documented infection  Poorly cleaned equipment most common cause  Slightly lower group B strep transmission rate  Drowning  Maternal:  Very low risk  Related to seizures or fainting  Newborn:  Very low risk. No evidence of increased risk of respiratory problems in multiple large studies  Physiological protection from breathing under water  Avoid underwater birth if there are any fetal complications  Once baby's head is out of the water, keep it out.  Birth complication  Some reports of cord trauma, but risk decreased by bringing baby to surface gradually  No evidence of increased risk of shoulder dystocia. Mothers can usually change positions faster in water than in a bed, possibly aiding the maneuvers to free the shoulder.   There are 2 things you MUST do to have a waterbirth with [facility]: Attend a waterbirth class at [facility]   3rd Wednesday of every month from 7-9 pm (virtual during COVID) [employer] at [email or web address] or [email or web address] or by calling [phone] Bring us  the certificate from the class to your prenatal appointment or send via MyChart Meet with a midwife at 36 weeks* to see if you can still plan a waterbirth and to sign the consent.   *We also recommend that you schedule as many of your prenatal visits with a midwife as possible.    Helpful information: You may want to bring a bathing suit top to the hospital to wear during labor but this is optional.  All other supplies are provided by the hospital. Please arrive at the hospital with signs of active labor, and do not wait at home until late in labor. It takes 45 min- 1 hour for fetal monitoring,  and check in to your room to take place, plus transport and filling of the waterbirth tub.  Things that would prevent you from having a waterbirth: Premature, <37wks  Previous cesarean birth  Presence of thick meconium-stained fluid  Multiple gestation (Twins, triplets, etc.)  Uncontrolled diabetes or gestational diabetes requiring medication  Hypertension diagnosed in pregnancy or preexisting hypertension (gestational hypertension, preeclampsia, or chronic hypertension) Fetal growth restriction (your baby measures less than 10th percentile on ultrasound) Heavy vaginal bleeding  Non-reassuring fetal heart rate  Active infection (MRSA, etc.). Group B Strep is NOT a contraindication for waterbirth.  If your labor has to be induced and induction method requires continuous monitoring of the baby's heart rate  Other risks/issues identified by your obstetrical provider   Please remember that birth is unpredictable. Under certain unforeseeable circumstances your provider may advise against giving birth in the tub. These decisions will be made on a case-by-case basis and with the safety of you and your baby as our highest priority.    Updated 04/07/21 Options for Doula Care in the [place]  As you review your birthing options, consider having a birth doula. A doula is trained to provide support before, during and just after you give birth. There are also postpartum doulas that help you adjust to new parenthood.  While doulas do not provide medical care, they do provide emotional, physical and educational support. A few months before your baby arrives, doulas can help answer questions, ease concerns and help you create and support your birthing plan.    Doulas can help reduce your stress and comfort you and your partner. They can help you cope with labor by helping you use breathing techniques, massage, creative labor positioning, essential oils and affirmations.   Studies show that the  benefits of having a doula include:   A more positive birth experience  Fewer requests for pain-relief medication  Less likelihood of cesarean section, commonly called a c-section   Doulas are typically hired via a Advertising account planner between you and the doula. We are happy to provide a list of the most active doulas in the area, all of whom are credentialed by [facility] and will not count as a visitor at your birth.  There are several options for no-cost doula care at our hospital, including:  [facility] Volunteer Doula Program Every [employer] Program A Cure 4 Moms Doula Study (available only at [employer] for Women, [place], [place] and [employer] [facility] offices)  For more information on these programs or to receive a list of doulas active in our area, please email [email or web address]

## 2023-10-13 ENCOUNTER — Ambulatory Visit: Payer: Self-pay | Admitting: Obstetrics and Gynecology

## 2023-10-13 DIAGNOSIS — Z3401 Encounter for supervision of normal first pregnancy, first trimester: Secondary | ICD-10-CM

## 2023-10-13 LAB — CBC/D/PLT+RPR+RH+ABO+RUBIGG...
Antibody Screen: NEGATIVE
Basophils Absolute: 0 x10E3/uL (ref 0.0–0.2)
Basos: 0 %
EOS (ABSOLUTE): 0 x10E3/uL (ref 0.0–0.4)
Eos: 1 %
HCV Ab: NONREACTIVE
HIV Screen 4th Generation wRfx: NONREACTIVE
Hematocrit: 35.8 % (ref 34.0–46.6)
Hemoglobin: 11.9 g/dL (ref 11.1–15.9)
Hepatitis B Surface Ag: NEGATIVE
Immature Grans (Abs): 0 x10E3/uL (ref 0.0–0.1)
Immature Granulocytes: 0 %
Lymphocytes Absolute: 2.4 x10E3/uL (ref 0.7–3.1)
Lymphs: 35 %
MCH: 31.9 pg (ref 26.6–33.0)
MCHC: 33.2 g/dL (ref 31.5–35.7)
MCV: 96 fL (ref 79–97)
Monocytes Absolute: 0.5 x10E3/uL (ref 0.1–0.9)
Monocytes: 7 %
Neutrophils Absolute: 3.8 x10E3/uL (ref 1.4–7.0)
Neutrophils: 57 %
Platelets: 290 x10E3/uL (ref 150–450)
RBC: 3.73 x10E6/uL — ABNORMAL LOW (ref 3.77–5.28)
RDW: 13.1 % (ref 11.7–15.4)
RPR Ser Ql: NONREACTIVE
Rh Factor: POSITIVE
Rubella Antibodies, IGG: 1.91 {index}
WBC: 6.8 x10E3/uL (ref 3.4–10.8)

## 2023-10-13 LAB — CERVICOVAGINAL ANCILLARY ONLY
Chlamydia: NEGATIVE
Comment: NEGATIVE
Comment: NEGATIVE
Comment: NORMAL
Neisseria Gonorrhea: NEGATIVE
Trichomonas: NEGATIVE

## 2023-10-13 LAB — HEMOGLOBIN A1C
Est. average glucose Bld gHb Est-mCnc: 97 mg/dL
Hgb A1c MFr Bld: 5 % (ref 4.8–5.6)

## 2023-10-13 LAB — HCV INTERPRETATION

## 2023-10-14 LAB — URINE CULTURE, OB REFLEX: Organism ID, Bacteria: NO GROWTH

## 2023-10-14 LAB — CULTURE, OB URINE

## 2023-10-29 LAB — PANORAMA PRENATAL TEST FULL PANEL:PANORAMA TEST PLUS 5 ADDITIONAL MICRODELETIONS: FETAL FRACTION: 5.1

## 2023-10-31 LAB — HORIZON CUSTOM: REPORT SUMMARY: NEGATIVE

## 2023-11-07 NOTE — Progress Notes (Unsigned)
   PRENATAL VISIT NOTE  Subjective:  Laurie Morrow is a 28 y.o. G2P0010 at [redacted]w[redacted]d being seen today for ongoing prenatal care.  She is currently monitored for the following issues for this low-risk pregnancy and has Abnormal uterine bleeding (AUB); Endometrial polyp; and Supervision of normal first pregnancy on their problem list.  Patient reports {sx:[id number]}.   .  .   . Denies leaking of fluid.   The following portions of the patient's history were reviewed and updated as appropriate: allergies, current medications, past family history, past medical history, past social history, past surgical history and problem list.   Objective:   There were no vitals filed for this visit.  Fetal Status:           General: Alert, oriented and cooperative. Patient is in no acute distress.  Skin: Skin is warm and dry. No rash noted.   Cardiovascular: Normal heart rate noted  Respiratory: Normal respiratory effort, no problems with respiration noted  Abdomen: Soft, gravid, appropriate for gestational age.        Pelvic: Cervical exam deferred        Extremities: Normal range of motion.     Mental Status: Normal mood and affect. Normal behavior. Normal judgment and thought content.      10/12/2023   11:32 AM 07/05/2019    4:33 PM 06/04/2019   11:39 AM  Depression screen PHQ 2/9  Decreased Interest 1 3 2   Down, Depressed, Hopeless 1 2 1   PHQ - 2 Score 2 5 3   Altered sleeping 2 2 3   Tired, decreased energy 2 3 2   Change in appetite 3 1 1   Feeling bad or failure about yourself  1 0 1  Trouble concentrating 1 3 1   Moving slowly or fidgety/restless 0 0 1  Suicidal thoughts 0 0 0  PHQ-9 Score 11 14 12   Difficult doing work/chores  Very difficult Very difficult        10/12/2023   11:32 AM 07/05/2019    4:36 PM 06/04/2019   11:39 AM 05/14/2019    9:41 AM  GAD 7 : Generalized Anxiety Score  Nervous, Anxious, on Edge 1 2 2 2   Control/stop worrying 1 2 3 1   Worry too much - different things 1 2 3 1    Trouble relaxing 1 3 2 3   Restless 0 0 1 1  Easily annoyed or irritable 2 2 2 2   Afraid - awful might happen 0 0 0 0  Total GAD 7 Score 6 11 13 10   Anxiety Difficulty  Very difficult Very difficult Somewhat difficult    Assessment and Plan:  Pregnancy: G2P0010 at [redacted]w[redacted]d 1. Supervision of normal first pregnancy, antepartum (Primary) Recommended afp today. Pt *** Anatomy us  scheduled for 12/4.  Offer flu shot today - ***  2. Pregnancy with 16 completed weeks gestation   Preterm labor symptoms and general obstetric precautions including but not limited to vaginal bleeding, contractions, leaking of fluid and fetal movement were reviewed in detail with the patient. Please refer to After Visit Summary for other counseling recommendations.   No follow-ups on file.  Future Appointments  Date Time Provider Department Center  11/09/2023  9:50 AM Cleatus Moccasin, MD [facility]  12/07/2023  8:00 AM [facility] PROVIDER 1 [facility]  12/07/2023  8:30 AM [facility] US3 [facility]    Moccasin Cleatus, MD

## 2023-11-09 ENCOUNTER — Encounter: Payer: Self-pay | Admitting: Obstetrics and Gynecology

## 2023-11-09 ENCOUNTER — Ambulatory Visit (INDEPENDENT_AMBULATORY_CARE_PROVIDER_SITE_OTHER): Admitting: Obstetrics and Gynecology

## 2023-11-09 VITALS — BP 116/73 | HR 83 | Wt 166.0 lb

## 2023-11-09 DIAGNOSIS — Z3400 Encounter for supervision of normal first pregnancy, unspecified trimester: Secondary | ICD-10-CM | POA: Diagnosis not present

## 2023-11-09 DIAGNOSIS — Z3A16 16 weeks gestation of pregnancy: Secondary | ICD-10-CM

## 2023-11-09 MED ORDER — PRENATAL 27-1 MG PO TABS: 1.0000 | ORAL_TABLET | Freq: Every day | ORAL | 3 refills | Status: DC

## 2023-11-09 NOTE — Patient Instructions (Signed)
 There are 2 things you MUST do to have a waterbirth with [facility]: Attend a waterbirth class at [facility] at [facility]   3rd Wednesday of every month from 7-9 pm (virtual during COVID) [employer] at [email or web address] or [email or web address] or by calling [phone] Bring us  the certificate from the class to your prenatal appointment or send via MyChart Meet with a midwife at 36 weeks* to see if you can still plan a waterbirth and to sign the consent.   *We also recommend that you schedule as many of your prenatal visits with a midwife as possible.

## 2023-11-09 NOTE — Progress Notes (Signed)
 Taking own PNV. Told by pharm, no PNV ordered. Not sure why.  Denies concerns today.

## 2023-11-11 ENCOUNTER — Ambulatory Visit: Payer: Self-pay | Admitting: Obstetrics and Gynecology

## 2023-11-11 LAB — AFP, SERUM, OPEN SPINA BIFIDA
AFP MoM: 1.03
AFP Value: 34.9 ng/mL
Gest. Age on Collection Date: 16 wk
Maternal Age At EDD: 30.2 a
OSBR Risk 1 IN: 10000
Test Results:: NEGATIVE
Weight: 166 [lb_av]

## 2023-11-19 ENCOUNTER — Emergency Department (HOSPITAL_BASED_OUTPATIENT_CLINIC_OR_DEPARTMENT_OTHER)
Admission: EM | Admit: 2023-11-19 | Discharge: 2023-11-19 | Disposition: A | Discharge status: Home / Self Care | Attending: Emergency Medicine | Admitting: Emergency Medicine

## 2023-11-19 ENCOUNTER — Encounter (HOSPITAL_BASED_OUTPATIENT_CLINIC_OR_DEPARTMENT_OTHER): Payer: Self-pay | Admitting: Emergency Medicine

## 2023-11-19 ENCOUNTER — Other Ambulatory Visit: Payer: Self-pay

## 2023-11-19 DIAGNOSIS — O26892 Other specified pregnancy related conditions, second trimester: Secondary | ICD-10-CM | POA: Insufficient documentation

## 2023-11-19 DIAGNOSIS — O98512 Other viral diseases complicating pregnancy, second trimester: Secondary | ICD-10-CM | POA: Diagnosis present

## 2023-11-19 DIAGNOSIS — U071 COVID-19: Secondary | ICD-10-CM | POA: Insufficient documentation

## 2023-11-19 DIAGNOSIS — Z3A17 17 weeks gestation of pregnancy: Secondary | ICD-10-CM | POA: Insufficient documentation

## 2023-11-19 DIAGNOSIS — Z7982 Long term (current) use of aspirin: Secondary | ICD-10-CM | POA: Diagnosis not present

## 2023-11-19 LAB — RESP PANEL BY RT-PCR (RSV, FLU A&B, COVID)  RVPGX2
Influenza A by PCR: NEGATIVE
Influenza B by PCR: NEGATIVE
Resp Syncytial Virus by PCR: NEGATIVE
SARS Coronavirus 2 by RT PCR: POSITIVE — AB

## 2023-11-19 MED ORDER — PAXLOVID (300/100) 20 X 150 MG & 10 X 100MG PO TBPK: 3.0000 | ORAL_TABLET | Freq: Two times a day (BID) | ORAL | 0 refills | Status: AC

## 2023-11-19 MED ORDER — PROCHLORPERAZINE EDISYLATE 10 MG/2ML IJ SOLN
10.0000 mg | Freq: Once | INTRAMUSCULAR | Status: AC
  Administered 2023-11-19: 10 mg via INTRAVENOUS
  Filled 2023-11-19: qty 2

## 2023-11-19 MED ORDER — SODIUM CHLORIDE 0.9 % IV BOLUS
1000.0000 mL | Freq: Once | INTRAVENOUS | Status: AC
  Administered 2023-11-19: 1000 mL via INTRAVENOUS

## 2023-11-19 MED ORDER — DIPHENHYDRAMINE HCL 50 MG/ML IJ SOLN
12.5000 mg | Freq: Once | INTRAMUSCULAR | Status: AC
  Administered 2023-11-19: 12.5 mg via INTRAVENOUS
  Filled 2023-11-19: qty 1

## 2023-11-19 NOTE — ED Triage Notes (Signed)
 Pt presents with severe frontal/R-sided HA since Thursday. She is 17w 3d pregnant. G2P1. Hx of migraines but pt states they are rare for her. +photophobia. Denies vision changes or other sx. BP 119/78 on arrival. Denies pregnancy complications- followed at [facility] in [place].

## 2023-11-19 NOTE — ED Provider Notes (Signed)
 [facility]  Provider Note  CSN: [id number] Arrival date & time: 11/19/23 0419  History Chief Complaint  Patient presents with   Headache    Laurie Morrow is a 28 y.o. female G2P1 at approx 17wks here for 2-3 days of throbbing frontal headache, photophobia no nausea. Worse bending over. Some congestion. No fever. Similar in some respects to prior migraine but not something she gets often. No other pregnancy related concerns. Otherwise normal pregnancy so far.    Home Medications Prior to Admission medications   Medication Sig Start Date End Date Taking? Authorizing Provider  nirmatrelvir/ritonavir (PAXLOVID, 300/100,) 20 x 150 MG & 10 x 100MG  TBPK Take 3 tablets by mouth 2 (two) times daily for 5 days. Patient GFR is >60. Take nirmatrelvir (150 mg) two tablets twice daily for 5 days and ritonavir (100 mg) one tablet twice daily for 5 days. 11/19/23 11/24/23 Yes Roselyn Carlin KATHEE, MD  aspirin EC 81 MG tablet Take 1 tablet (81 mg total) by mouth daily. Swallow whole. 10/12/23   Delores Nidia CROME, FNP  Doxylamine-Pyridoxine (DICLEGIS) 10-10 MG TBEC Take 2 tablets by mouth at bedtime. May add 1 tablet at breakfast and 1 tablet at lunch if needed. 10/12/23   Delores Nidia CROME, FNP  Prenatal 27-1 MG TABS Take 1 tablet by mouth daily. 11/09/23   Cleatus Moccasin, MD     Allergies    Patient has no known allergies.   Review of Systems   Review of Systems Please see HPI for pertinent positives and negatives  Physical Exam BP 119/78 (BP Location: Right Arm)   Pulse 84   Temp 99.7 F (37.6 C) (Oral)   Resp 20   Ht 5' 2 (1.575 m)   Wt 75.3 kg   LMP 07/20/2023 (Exact Date) Comment: negative HCG quantitiative 06/13/22  SpO2 97%   BMI 30.36 kg/m   Physical Exam Vitals and nursing note reviewed.  Constitutional:      Appearance: Normal appearance.  HENT:     Head: Normocephalic and atraumatic.     Nose: Nose normal.     Mouth/Throat:      Mouth: Mucous membranes are moist.  Eyes:     Extraocular Movements: Extraocular movements intact.     Conjunctiva/sclera: Conjunctivae normal.  Cardiovascular:     Rate and Rhythm: Normal rate.  Pulmonary:     Effort: Pulmonary effort is normal.     Breath sounds: Normal breath sounds.  Abdominal:     General: Abdomen is flat.     Palpations: Abdomen is soft.     Tenderness: There is no abdominal tenderness.  Musculoskeletal:        General: No swelling. Normal range of motion.     Cervical back: Neck supple.  Skin:    General: Skin is warm and dry.  Neurological:     General: No focal deficit present.     Mental Status: She is alert.     Cranial Nerves: No cranial nerve deficit.     Gait: Gait normal.  Psychiatric:        Mood and Affect: Mood normal.     ED Results / Procedures / Treatments   EKG None  Procedures Procedures  Medications Ordered in the ED Medications  sodium chloride  0.9 % bolus 1,000 mL (1,000 mLs Intravenous New Bag/Given 11/19/23 0448)  prochlorperazine (COMPAZINE) injection 10 mg (10 mg Intravenous Given 11/19/23 0448)  diphenhydrAMINE (BENADRYL) injection 12.5 mg (12.5 mg  Intravenous Given 11/19/23 0448)    Initial Impression and Plan  Patient here with migrainous headache in pregnancy, some possible mild URI symptoms. Will check Covid swab, give compazine, benadryl and IVF.   ED Course   Clinical Course as of 11/19/23 0543  Sun Nov 19, 2023  0535 Covid is positive. Patient is unvaccinated and pregnant, will give Rx for Paxlovid. Recommend rest, oral hydration, APAP for headache, isolation and PCP follow up, RTED for any other concerns.   [CS]    Clinical Course User Index [CS] Roselyn Carlin NOVAK, MD     MDM Rules/Calculators/A&P Medical Decision Making Problems Addressed: COVID-19 affecting pregnancy in second trimester: acute illness or injury  Amount and/or Complexity of Data Reviewed Labs: ordered. Decision-making details  documented in ED Course.  Risk Prescription drug management.     Final Clinical Impression(s) / ED Diagnoses Final diagnoses:  COVID-19 affecting pregnancy in second trimester    Rx / DC Orders ED Discharge Orders          Ordered    nirmatrelvir/ritonavir (PAXLOVID, 300/100,) 20 x 150 MG & 10 x 100MG  TBPK  2 times daily        11/19/23 0543             Roselyn Carlin NOVAK, MD 11/19/23 [phone]

## 2023-11-23 DIAGNOSIS — U071 COVID-19: Secondary | ICD-10-CM | POA: Insufficient documentation

## 2023-11-23 DIAGNOSIS — O99210 Obesity complicating pregnancy, unspecified trimester: Secondary | ICD-10-CM | POA: Insufficient documentation

## 2023-12-07 ENCOUNTER — Ambulatory Visit: Attending: Obstetrics and Gynecology | Admitting: Maternal & Fetal Medicine

## 2023-12-07 ENCOUNTER — Other Ambulatory Visit

## 2023-12-07 VITALS — BP 122/72 | HR 88

## 2023-12-07 DIAGNOSIS — O283 Abnormal ultrasonic finding on antenatal screening of mother: Secondary | ICD-10-CM | POA: Diagnosis not present

## 2023-12-07 DIAGNOSIS — Z3401 Encounter for supervision of normal first pregnancy, first trimester: Secondary | ICD-10-CM | POA: Diagnosis not present

## 2023-12-07 DIAGNOSIS — Z3A20 20 weeks gestation of pregnancy: Secondary | ICD-10-CM

## 2023-12-07 DIAGNOSIS — U071 COVID-19: Secondary | ICD-10-CM

## 2023-12-07 DIAGNOSIS — O99210 Obesity complicating pregnancy, unspecified trimester: Secondary | ICD-10-CM

## 2023-12-07 NOTE — Progress Notes (Signed)
 Patient information  Patient Name: Laurie Morrow  Patient MRN:   [id number]  Referring practice: MFM Referring Provider: [facility]  Problem List   Patient Active Problem List   Diagnosis Date Noted   Obesity affecting pregnancy, antepartum 11/23/2023   COVID-+ 11/19/23 11/23/2023   Supervision of normal first pregnancy 10/12/2023   Endometrial polyp 06/08/2022   Abnormal uterine bleeding (AUB) 05/14/2019   Maternal Fetal Medicine Consult Laurie Morrow is a 28 y.o. G2P0010 at [redacted]w[redacted]d here for ultrasound and consultation. She had low risk aneuploidy screening of a female fetus. Carrier screening was Negative for the basic screening (SMA, alpha-thal, beta-thal, and cystic fibroisis. Maternal serum AFP was negative. She has no acute concerns.   Today we focused on the following:   Echogenic intracardiac focus An intracardiac echogenic focus was noted on today's ultrasound. This is thought to represent a calcification of the ventricular papillary muscles.  There is no adverse cardiac function associated with this finding.  This is seen in about 3% to 4% of normal pregnancies but is higher in a pregnancy affected by Down Syndrome. Historically there was association with aneuploidy but in the presence of normal aneuploidy screening this is considered a normal variant. I reassured the patient that the likelihood of Down syndrome is extremely low (<1/10,000) based on her NIPT testing.   Recommendations -After review of the patients LMP, menstrual cycle history and any prior US , the EDD should be Estimated Date of Delivery: 04/29/24.  -Since the patients EDD was changed due to irregular cycles we will f/u with a growth US  in 6 weeks  45 minutes of time was spent reviewing the patient's chart including labs, imaging and documentation.  At least 50% of this time was spent with direct patient care discussing the diagnosis, management and prognosis of her care.  Review of  Systems: A review of systems was performed and was negative except per HPI   Past Obstetrical History:  OB History  Gravida Para Term Preterm AB Living  2 0 0 0 1 0  SAB IAB Ectopic Multiple Live Births  1 0 0 0 0    # Outcome Date GA Lbr Len/2nd Weight Sex Type Anes PTL Lv  2 Current           1 SAB 12/2022             Past Medical History:  Past Medical History:  Diagnosis Date   Abnormal uterine bleeding (AUB) 05/14/2019   Anxiety    B12 deficiency 05/17/2019   Complication of anesthesia    COVID    Depression with anxiety 05/14/2019   Iron  deficiency 06/04/2019   PONV (postoperative nausea and vomiting)    Strep throat      Past Surgical History:    Past Surgical History:  Procedure Laterality Date   HYSTEROSCOPY WITH D & C N/A 08/03/2022   Procedure: DILATATION AND CURETTAGE /HYSTEROSCOPY/ POLYPECTOMY;  Surgeon: Herchel Gloris LABOR, MD;  Location: [facility];  Service: Gynecology;  Laterality: N/A;   KNEE SURGERY     left     Home Medications:   Current Outpatient Medications on File Prior to Visit  Medication Sig Dispense Refill   Prenatal Vit-Fe Fumarate-FA (PRENATAL MULTIVITAMIN) TABS tablet Take 1 tablet by mouth daily at 12 noon.     aspirin  EC 81 MG tablet Take 1 tablet (81 mg total) by mouth daily. Swallow whole. 30 tablet 12   Doxylamine -Pyridoxine  (DICLEGIS ) 10-10  MG TBEC Take 2 tablets by mouth at bedtime. May add 1 tablet at breakfast and 1 tablet at lunch if needed. 100 tablet 2   Prenatal 27-1 MG TABS Take 1 tablet by mouth daily. 90 tablet 3   No current facility-administered medications on file prior to visit.      Allergies:   No Known Allergies   Physical Exam:   Vitals:   12/07/23 0816  BP: 122/72  Pulse: 88   Sitting comfortably on the sonogram table Nonlabored breathing Normal rate and rhythm Abdomen is nontender  Thank you for the opportunity to be involved with this patient's care. Please let us  know if we can be  of any further assistance.   Delora Smaller MFM, [facility]   12/07/2023  9:54 AM

## 2023-12-08 ENCOUNTER — Ambulatory Visit: Admitting: Obstetrics and Gynecology

## 2023-12-08 VITALS — BP 106/69 | HR 87 | Wt 172.0 lb

## 2023-12-08 DIAGNOSIS — O283 Abnormal ultrasonic finding on antenatal screening of mother: Secondary | ICD-10-CM

## 2023-12-08 DIAGNOSIS — Z3402 Encounter for supervision of normal first pregnancy, second trimester: Secondary | ICD-10-CM

## 2023-12-08 DIAGNOSIS — O99210 Obesity complicating pregnancy, unspecified trimester: Secondary | ICD-10-CM

## 2023-12-08 MED ORDER — FAMOTIDINE 20 MG PO TABS: 20.0000 mg | ORAL_TABLET | Freq: Two times a day (BID) | ORAL | 1 refills | Status: AC

## 2023-12-08 NOTE — Progress Notes (Signed)
 PRENATAL VISIT NOTE  Subjective:  Laurie Morrow is a 28 y.o. G2P0010 at [redacted]w[redacted]d being seen today for ongoing prenatal care.  She is currently monitored for the following issues for this low-risk pregnancy and has Supervision of normal first pregnancy; Obesity affecting pregnancy, antepartum; COVID-+ 11/19/23; and Fetal echogenic intracardiac focus on prenatal ultrasound on their problem list.  Patient reports no complaints.  Contractions: Not present. Vag. Bleeding: None.  Movement: Present. Denies leaking of fluid.   The following portions of the patient's history were reviewed and updated as appropriate: allergies, current medications, past family history, past medical history, past social history, past surgical history and problem list.   Objective:   Vitals:   12/08/23 0941  BP: 106/69  Pulse: 87  Weight: 172 lb (78 kg)    Fetal Status:  Fetal Heart Rate (bpm): 143   Movement: Present    General: Alert, oriented and cooperative. Patient is in no acute distress.  Skin: Skin is warm and dry. No rash noted.   Cardiovascular: Normal heart rate noted  Respiratory: Normal respiratory effort, no problems with respiration noted  Abdomen: Soft, gravid, appropriate for gestational age.  Pain/Pressure: Present     Pelvic: Cervical exam deferred        Extremities: Normal range of motion.  Edema: None  Mental Status: Normal mood and affect. Normal behavior. Normal judgment and thought content.      10/12/2023   11:32 AM 07/05/2019    4:33 PM 06/04/2019   11:39 AM  Depression screen PHQ 2/9  Decreased Interest 1 3 2   Down, Depressed, Hopeless 1 2 1   PHQ - 2 Score 2 5 3   Altered sleeping 2 2 3   Tired, decreased energy 2 3 2   Change in appetite 3 1 1   Feeling bad or failure about yourself  1 0 1  Trouble concentrating 1 3 1   Moving slowly or fidgety/restless 0 0 1  Suicidal thoughts 0 0 0  PHQ-9 Score 11  14  12    Difficult doing work/chores  Very difficult Very difficult      Data saved with a previous flowsheet row definition        10/12/2023   11:32 AM 07/05/2019    4:36 PM 06/04/2019   11:39 AM 05/14/2019    9:41 AM  GAD 7 : Generalized Anxiety Score  Nervous, Anxious, on Edge 1 2 2 2   Control/stop worrying 1 2 3 1   Worry too much - different things 1 2 3 1   Trouble relaxing 1 3 2 3   Restless 0 0 1 1  Easily annoyed or irritable 2 2 2 2   Afraid - awful might happen 0 0 0 0  Total GAD 7 Score 6 11 13 10   Anxiety Difficulty  Very difficult Very difficult Somewhat difficult    Assessment and Plan:  Pregnancy: G2P0010 at [redacted]w[redacted]d  1. Encounter for supervision of normal first pregnancy in second trimester (Primary)  Feeling good, no concerns Planning WB, plan for CNM visit  Doula information given.   2. Fetal echogenic intracardiac focus on prenatal ultrasound  Follow up scheduled with MFM.   3. Obesity affecting pregnancy, antepartum, unspecified obesity type  Start ASA- hasn't started but planning to.   Preterm labor symptoms and general obstetric precautions including but not limited to vaginal bleeding, contractions, leaking of fluid and fetal movement were reviewed in detail with the patient. Please refer to After Visit Summary for other counseling recommendations.   Return Please  schedule with CNM for WB.  Future Appointments  Date Time Provider Department Center  01/05/2024  9:10 AM Donzell Coller, Delon FERNS, NP [facility]  01/18/2024  8:15 AM [facility] PROVIDER 1 [facility]  01/18/2024  8:30 AM [facility] US2 [facility]    Delon Emms, NP

## 2023-12-08 NOTE — Patient Instructions (Signed)
 Options for Doula Care in the [place]  As you review your birthing options, consider having a birth doula. A doula is trained to provide support before, during and just after you give birth. There are also postpartum doulas that help you adjust to new parenthood.  While doulas do not provide medical care, they do provide emotional, physical and educational support. A few months before your baby arrives, doulas can help answer questions, ease concerns and help you create and support your birthing plan.    Doulas can help reduce your stress and comfort you and your partner. They can help you cope with labor by helping you use breathing techniques, massage, creative labor positioning, essential oils and affirmations.   Studies show that the benefits of having a doula include:   A more positive birth experience  Fewer requests for pain-relief medication  Less likelihood of cesarean section, commonly called a c-section   Doulas are typically hired via a [employer] account planner between you and the doula. We are happy to provide a list of the most active doulas in the area, all of whom are credentialed by [facility] and will not count as a visitor at your birth.  There are several options for no-cost doula care at our hospital, including:  [facility] Volunteer Doula Program Every [employer] Program A Cure 4 Moms Doula Study (available only at [employer] for [facility], [place], [place] and [facility] offices)  For more information on these programs or to receive a list of doulas active in our area, please email [email or web address]

## 2024-01-05 ENCOUNTER — Ambulatory Visit: Admitting: Obstetrics and Gynecology

## 2024-01-05 VITALS — BP 107/70 | HR 92 | Wt 175.0 lb

## 2024-01-05 DIAGNOSIS — Z3A23 23 weeks gestation of pregnancy: Secondary | ICD-10-CM | POA: Diagnosis not present

## 2024-01-05 DIAGNOSIS — Z3402 Encounter for supervision of normal first pregnancy, second trimester: Secondary | ICD-10-CM

## 2024-01-05 DIAGNOSIS — O283 Abnormal ultrasonic finding on antenatal screening of mother: Secondary | ICD-10-CM

## 2024-01-05 NOTE — Progress Notes (Signed)
 "  PRENATAL VISIT NOTE  Subjective:  Laurie Morrow is a 29 y.o. G2P0010 at [redacted]w[redacted]d being seen today for ongoing prenatal care.  She is currently monitored for the following issues for this low-risk pregnancy and has Supervision of normal first pregnancy; Obesity affecting pregnancy, antepartum; COVID-+ 11/19/23; and Fetal echogenic intracardiac focus on prenatal ultrasound on their problem list.  Patient reports no complaints.  Contractions: Not present. Vag. Bleeding: None.  Movement: Present. Denies leaking of fluid.   The following portions of the patient's history were reviewed and updated as appropriate: allergies, current medications, past family history, past medical history, past social history, past surgical history and problem list.   Objective:   Vitals:   01/05/24 0944  BP: 107/70  Pulse: 92  Weight: 175 lb (79.4 kg)    Fetal Status:  Fetal Heart Rate (bpm): 145 Fundal Height: 25 cm Movement: Present    General: Alert, oriented and cooperative. Patient is in no acute distress.  Skin: Skin is warm and dry. No rash noted.   Cardiovascular: Normal heart rate noted  Respiratory: Normal respiratory effort, no problems with respiration noted  Abdomen: Soft, gravid, appropriate for gestational age.  Pain/Pressure: Absent     Pelvic: Cervical exam deferred        Extremities: Normal range of motion.  Edema: None  Mental Status: Normal mood and affect. Normal behavior. Normal judgment and thought content.      10/12/2023   11:32 AM 07/05/2019    4:33 PM 06/04/2019   11:39 AM  Depression screen PHQ 2/9  Decreased Interest 1 3 2   Down, Depressed, Hopeless 1 2 1   PHQ - 2 Score 2 5 3   Altered sleeping 2 2 3   Tired, decreased energy 2 3 2   Change in appetite 3 1 1   Feeling bad or failure about yourself  1 0 1  Trouble concentrating 1 3 1   Moving slowly or fidgety/restless 0 0 1  Suicidal thoughts 0 0 0  PHQ-9 Score 11  14  12    Difficult doing work/chores  Very difficult Very  difficult     Data saved with a previous flowsheet row definition        10/12/2023   11:32 AM 07/05/2019    4:36 PM 06/04/2019   11:39 AM 05/14/2019    9:41 AM  GAD 7 : Generalized Anxiety Score  Nervous, Anxious, on Edge 1 2 2 2   Control/stop worrying 1 2 3 1   Worry too much - different things 1 2 3 1   Trouble relaxing 1 3 2 3   Restless 0 0 1 1  Easily annoyed or irritable 2 2 2 2   Afraid - awful might happen 0 0 0 0  Total GAD 7 Score 6 11 13 10   Anxiety Difficulty  Very difficult Very difficult Somewhat difficult    Assessment and Plan:  Pregnancy: G2P0010 at [redacted]w[redacted]d  1. Encounter for supervision of normal first pregnancy in second trimester (Primary)  Taking BASA Feeling good Has [facility] CNM visit on 1/9  2. Fetal echogenic intracardiac focus on prenatal ultrasound  Normal NIPs Has follow up with MFM on 1/15   Preterm labor symptoms and general obstetric precautions including but not limited to vaginal bleeding, contractions, leaking of fluid and fetal movement were reviewed in detail with the patient. Please refer to After Visit Summary for other counseling recommendations.   No follow-ups on file.  Future Appointments  Date Time Provider Department Center  01/12/2024  8:30 AM  Regino Camie DELENA EDDY [facility]  01/18/2024  8:15 AM [facility] PROVIDER 1 [facility]  01/18/2024  8:30 AM [facility] US2 [facility]  02/05/2024  8:30 AM Leggett, Burnard DEL, MD [facility]    Delon Emms, NP  "

## 2024-01-05 NOTE — Patient Instructions (Signed)
   Considering Waterbirth? Guide for patients at Center for [facility]) Why consider waterbirth? Gentle birth for babies  Less pain medicine used in labor  May allow for passive descent/less pushing  May reduce perineal tears  More mobility and instinctive maternal position changes  Increased maternal relaxation   Is waterbirth safe? What are the risks of infection, drowning or other complications? Infection:  Very low risk (3.7 % for tub vs 4.8% for bed)  7 in 8000 waterbirths with documented infection  Poorly cleaned equipment most common cause  Slightly lower group B strep transmission rate  Drowning  Maternal:  Very low risk  Related to seizures or fainting  Newborn:  Very low risk. No evidence of increased risk of respiratory problems in multiple large studies  Physiological protection from breathing under water  Avoid underwater birth if there are any fetal complications  Once baby's head is out of the water, keep it out.  Birth complication  Some reports of cord trauma, but risk decreased by bringing baby to surface gradually  No evidence of increased risk of shoulder dystocia. Mothers can usually change positions faster in water than in a bed, possibly aiding the maneuvers to free the shoulder.   There are 2 things you MUST do to have a waterbirth with [facility]: Attend a waterbirth class at [facility] at [facility]   3rd Wednesday of every month from 7-9 pm (virtual during COVID) [employer] at [email or web address] or [email or web address] or by calling [phone] Bring us  the certificate from the class to your prenatal appointment or send via MyChart Meet with a midwife at 36 weeks* to see if you can still plan a waterbirth and to sign the consent.   *We also recommend that you schedule as many of your prenatal visits with a midwife as possible.    Helpful information: You may want to bring a bathing suit top to the hospital  to wear during labor but this is optional.  All other supplies are provided by the hospital. Please arrive at the hospital with signs of active labor, and do not wait at home until late in labor. It takes 45 min- 1 hour for fetal monitoring, and check in to your room to take place, plus transport and filling of the waterbirth tub.    Things that would prevent you from having a waterbirth: Premature, <37wks  Previous cesarean birth  Presence of thick meconium-stained fluid  Multiple gestation (Twins, triplets, etc.)  Uncontrolled diabetes or gestational diabetes requiring medication  Hypertension diagnosed in pregnancy or preexisting hypertension (gestational hypertension, preeclampsia, or chronic hypertension) Fetal growth restriction (your baby measures less than 10th percentile on ultrasound) Heavy vaginal bleeding  Non-reassuring fetal heart rate  Active infection (MRSA, etc.). Group B Strep is NOT a contraindication for waterbirth.  If your labor has to be induced and induction method requires continuous monitoring of the baby's heart rate  Other risks/issues identified by your obstetrical provider   Please remember that birth is unpredictable. Under certain unforeseeable circumstances your provider may advise against giving birth in the tub. These decisions will be made on a case-by-case basis and with the safety of you and your baby as our highest priority.    Updated 04/07/21

## 2024-01-11 NOTE — Progress Notes (Signed)
 Patient did not arrive to telehealth appointment. May reschedule.  Camie Rote, MSN, CNM, RNC-OB Certified Nurse Midwife, [facility] 01/12/2024 10:23 AM

## 2024-01-12 ENCOUNTER — Telehealth: Payer: Self-pay

## 2024-01-12 ENCOUNTER — Telehealth: Admitting: Certified Nurse Midwife

## 2024-01-12 DIAGNOSIS — Z3402 Encounter for supervision of normal first pregnancy, second trimester: Secondary | ICD-10-CM

## 2024-01-12 DIAGNOSIS — Z3A24 24 weeks gestation of pregnancy: Secondary | ICD-10-CM

## 2024-01-12 NOTE — Telephone Encounter (Signed)
 RN attempted to call patient to request patient to connect to video visit, left  HIPAA compliant voicemail to return office call.  Silvano LELON Piano, RN

## 2024-01-18 ENCOUNTER — Ambulatory Visit (HOSPITAL_BASED_OUTPATIENT_CLINIC_OR_DEPARTMENT_OTHER): Admitting: Maternal & Fetal Medicine

## 2024-01-18 ENCOUNTER — Ambulatory Visit: Attending: Maternal & Fetal Medicine

## 2024-01-18 DIAGNOSIS — O365920 Maternal care for other known or suspected poor fetal growth, second trimester, not applicable or unspecified: Secondary | ICD-10-CM | POA: Diagnosis not present

## 2024-01-18 DIAGNOSIS — Z3A25 25 weeks gestation of pregnancy: Secondary | ICD-10-CM

## 2024-01-18 DIAGNOSIS — Z3A20 20 weeks gestation of pregnancy: Secondary | ICD-10-CM | POA: Diagnosis not present

## 2024-01-18 DIAGNOSIS — O99210 Obesity complicating pregnancy, unspecified trimester: Secondary | ICD-10-CM | POA: Diagnosis not present

## 2024-01-18 NOTE — Progress Notes (Signed)
 "  Patient information  Patient Name: Laurie Morrow  Patient MRN:   [id number]  Referring practice: MFM Referring Provider: [facility]  Problem List   Patient Active Problem List   Diagnosis Date Noted   Fetal echogenic intracardiac focus on prenatal ultrasound 12/07/2023   Obesity affecting pregnancy, antepartum 11/23/2023   COVID-+ 11/19/23 11/23/2023   Supervision of pregnancy, antepartum 10/12/2023   Maternal Fetal medicine Consult  Laurie Morrow is a 29 y.o. G2P0010 at [redacted]w[redacted]d here for ultrasound and consultation. Laurie Morrow is doing well today with no acute concerns. Today we focused on the following:   The patient is here for a follow-up growth ultrasound after a prior change in her EDD due to irregular cycles. On her anatomy US  we  also identified echogenic intracranial finding, which has caused the patient some concern after Googling it. We reviewed her prior screening results and the risk is <1/10,000 of having a fetus affected by DS. We also discussed the role of amniocentesis for further genetic evaluation. The patient expressed relief and does not wish to pursue any additional genetic or diagnostic testing. She is satisfied with her estimated residual risk based on completed screening and verbalized understanding and acceptance of this plan.  On todays ultrasound, the estimated fetal weight is between the 10th and 20th percentile. This is technically within the normal range; however, we discussed the need for close follow-up to ensure the fetus does not declare fetal growth restriction on subsequent ultrasounds. We reviewed the clinical implications of fetal growth restriction, particularly as it relates to increased surveillance and potential impact on delivery timing.  The patient expressed anxiety regarding delivery timing, noting that her full-term benefits do not mature until the week after her current estimated due date. I discussed that if  fetal growth and testing remain reassuring, delivery after the due date may be reasonable; however, this cannot be guaranteed and will depend on interval growth and ongoing surveillance. The patient verbalized understanding.  RECOMMENDATIONS -Acknowledge patients informed decision to decline amniocentesis and further genetic testing -Repeat growth ultrasound to monitor for possible fetal growth restriction -Continue close surveillance given fetal weight between the 10th-20th percentile -Discuss delivery timing based on interval growth and antenatal testing -Continue routine prenatal care with OB provider and return to MFM as indicated  The patient had time to ask questions that were answered to her satisfaction.  She verbalized understanding and agrees to proceed with the plan above.   I spent 30 minutes reviewing the patients chart, including labs and images as well as counseling the patient about her medical conditions. Greater than 50% of the time was spent in direct face-to-face patient counseling.  Delora Smaller  MFM, [place]   01/18/2024  9:08 AM   Review of Systems: A review of systems was performed and was negative except per HPI   Vitals and Physical Exam    01/18/2024    8:51 AM 01/05/2024    9:44 AM 12/08/2023    9:41 AM  Vitals with BMI  Weight  175 lbs 172 lbs  BMI   31.45  Systolic 123 107 893  Diastolic 64 70 69  Pulse 81 92 87    Sitting comfortably on the sonogram table Nonlabored breathing Normal rate and rhythm Abdomen is nontender  Past pregnancies OB History  Gravida Para Term Preterm AB Living  2 0 0 0 1 0  SAB IAB Ectopic Multiple Live Births  1 0 0 0  0    # Outcome Date GA Lbr Len/2nd Weight Sex Type Anes PTL Lv  2 Current           1 SAB 12/2022             Future Appointments  Date Time Provider Department Center  02/05/2024  8:30 AM Cris Burnard DEL, MD [facility]      "

## 2024-02-05 ENCOUNTER — Encounter: Admitting: Obstetrics & Gynecology

## 2024-02-08 ENCOUNTER — Encounter: Payer: Self-pay | Admitting: Obstetrics and Gynecology

## 2024-02-08 ENCOUNTER — Ambulatory Visit: Admitting: Obstetrics and Gynecology

## 2024-02-08 VITALS — BP 116/76 | HR 101 | Wt 177.0 lb

## 2024-02-08 DIAGNOSIS — O283 Abnormal ultrasonic finding on antenatal screening of mother: Secondary | ICD-10-CM

## 2024-02-08 DIAGNOSIS — Z3A28 28 weeks gestation of pregnancy: Secondary | ICD-10-CM

## 2024-02-08 DIAGNOSIS — O99210 Obesity complicating pregnancy, unspecified trimester: Secondary | ICD-10-CM

## 2024-02-08 DIAGNOSIS — O0990 Supervision of high risk pregnancy, unspecified, unspecified trimester: Secondary | ICD-10-CM

## 2024-02-08 NOTE — Progress Notes (Signed)
" ° °  PRENATAL VISIT NOTE  Subjective:  Laurie Morrow is a 29 y.o. G2P0010 at [redacted]w[redacted]d being seen today for ongoing prenatal care.  She is currently monitored for the following issues for this low-risk pregnancy and has Supervision of pregnancy, antepartum; Obesity affecting pregnancy, antepartum; COVID-+ 11/19/23; and Fetal echogenic intracardiac focus on prenatal ultrasound on their problem list.  Patient reports no complaints.  Contractions: Not present. Vag. Bleeding: None.  Movement: Present. Denies leaking of fluid.   The following portions of the patient's history were reviewed and updated as appropriate: allergies, current medications, past family history, past medical history, past social history, past surgical history and problem list.   Objective:   Vitals:   02/08/24 0838  BP: 116/76  Pulse: (!) 101  Weight: 177 lb (80.3 kg)    Fetal Status:      Movement: Present    General: Alert, oriented and cooperative. Patient is in no acute distress.  Skin: Skin is warm and dry. No rash noted.   Cardiovascular: Normal heart rate noted  Respiratory: Normal respiratory effort, no problems with respiration noted  Abdomen: Soft, gravid, appropriate for gestational age.  Pain/Pressure: Absent     Pelvic: Cervical exam deferred        Extremities: Normal range of motion.  Edema: None  Mental Status: Normal mood and affect. Normal behavior. Normal judgment and thought content.   Assessment and Plan:  Pregnancy: G2P0010 at [redacted]w[redacted]d 1. Supervision of pregnancy, antepartum (Primary) Plan for follow up US  as last growth was 16%. Was not scheduled before she left. We will get her in for follow up. FH normal today at 29.  28w labs today TDAP offered - pt would like to consider. Will ask at next appointment.  She would prefer IOL on 5/5. This is c/w 41.2 weeks. Discussed this would be an option so long as testing is normal and no other indication for earlier delivery I.e. GHTN, FGR, etc.    2.  Pregnancy with 28 completed weeks gestation  3. Obesity affecting pregnancy, antepartum, unspecified obesity type  4. Fetal echogenic intracardiac focus on prenatal ultrasound LR NIPS, declines amnio.   Preterm labor symptoms and general obstetric precautions including but not limited to vaginal bleeding, contractions, leaking of fluid and fetal movement were reviewed in detail with the patient. Please refer to After Visit Summary for other counseling recommendations.   No follow-ups on file.  No future appointments.   Vina Solian, MD "

## 2024-02-09 ENCOUNTER — Ambulatory Visit: Payer: Self-pay | Admitting: Obstetrics and Gynecology

## 2024-02-09 DIAGNOSIS — O99019 Anemia complicating pregnancy, unspecified trimester: Secondary | ICD-10-CM

## 2024-02-09 LAB — SYPHILIS: RPR W/REFLEX TO RPR TITER AND TREPONEMAL ANTIBODIES, TRADITIONAL SCREENING AND DIAGNOSIS ALGORITHM: RPR Ser Ql: NONREACTIVE

## 2024-02-09 LAB — CBC
Hematocrit: 31.2 % — ABNORMAL LOW (ref 34.0–46.6)
Hemoglobin: 10.7 g/dL — ABNORMAL LOW (ref 11.1–15.9)
MCH: 32.5 pg (ref 26.6–33.0)
MCHC: 34.3 g/dL (ref 31.5–35.7)
MCV: 95 fL (ref 79–97)
Platelets: 250 10*3/uL (ref 150–450)
RBC: 3.29 x10E6/uL — ABNORMAL LOW (ref 3.77–5.28)
RDW: 12.8 % (ref 11.7–15.4)
WBC: 6.7 10*3/uL (ref 3.4–10.8)

## 2024-02-09 LAB — GLUCOSE TOLERANCE, 2 HOURS W/ 1HR
Glucose, 1 hour: 155 mg/dL (ref 70–179)
Glucose, 2 hour: 105 mg/dL (ref 70–152)
Glucose, Fasting: 87 mg/dL (ref 70–91)

## 2024-02-09 LAB — HIV ANTIBODY (ROUTINE TESTING W REFLEX): HIV Screen 4th Generation wRfx: NONREACTIVE

## 2024-02-09 MED ORDER — FERROUS SULFATE 325 (65 FE) MG PO TBEC: 325.0000 mg | DELAYED_RELEASE_TABLET | ORAL | 3 refills | Status: AC

## 2024-02-23 ENCOUNTER — Encounter: Admitting: Certified Nurse Midwife

## 2024-02-27 ENCOUNTER — Ambulatory Visit

## 2024-03-08 ENCOUNTER — Encounter: Admitting: Certified Nurse Midwife

## 2024-03-22 ENCOUNTER — Encounter: Admitting: Certified Nurse Midwife

## 2024-04-04 ENCOUNTER — Encounter: Admitting: Obstetrics and Gynecology

## 2024-04-12 ENCOUNTER — Encounter: Admitting: Certified Nurse Midwife

## 2024-04-19 ENCOUNTER — Encounter: Admitting: Certified Nurse Midwife
